# Patient Record
Sex: Female | Born: 1975 | Race: Asian | Hispanic: No | Marital: Married | Smoking: Never smoker
Health system: Southern US, Community
[De-identification: ages and names within clinical notes are randomized; demographics above are authoritative.]

## PROBLEM LIST (undated history)

## (undated) DIAGNOSIS — J45909 Unspecified asthma, uncomplicated: Secondary | ICD-10-CM

## (undated) DIAGNOSIS — A159 Respiratory tuberculosis unspecified: Secondary | ICD-10-CM

## (undated) DIAGNOSIS — R12 Heartburn: Secondary | ICD-10-CM

## (undated) HISTORY — PX: NO PAST SURGERIES: SHX2092

## (undated) HISTORY — DX: Heartburn: R12

## (undated) HISTORY — DX: Respiratory tuberculosis unspecified: A15.9

## (undated) HISTORY — DX: Unspecified asthma, uncomplicated: J45.909

## (undated) HISTORY — PX: OTHER SURGICAL HISTORY: SHX169

---

## 2022-01-20 ENCOUNTER — Encounter (INDEPENDENT_AMBULATORY_CARE_PROVIDER_SITE_OTHER): Payer: Self-pay

## 2022-01-20 ENCOUNTER — Encounter: Payer: Self-pay | Admitting: Nurse Practitioner

## 2022-01-20 ENCOUNTER — Other Ambulatory Visit: Payer: Self-pay

## 2022-01-20 ENCOUNTER — Ambulatory Visit (INDEPENDENT_AMBULATORY_CARE_PROVIDER_SITE_OTHER): Payer: Medicaid Other | Admitting: Nurse Practitioner

## 2022-01-20 VITALS — BP 111/77 | HR 74 | Temp 98.9°F | Ht 65.0 in | Wt 148.6 lb

## 2022-01-20 DIAGNOSIS — M545 Low back pain, unspecified: Secondary | ICD-10-CM

## 2022-01-20 DIAGNOSIS — R319 Hematuria, unspecified: Secondary | ICD-10-CM | POA: Diagnosis not present

## 2022-01-20 DIAGNOSIS — R112 Nausea with vomiting, unspecified: Secondary | ICD-10-CM

## 2022-01-20 DIAGNOSIS — R519 Headache, unspecified: Secondary | ICD-10-CM | POA: Diagnosis not present

## 2022-01-20 DIAGNOSIS — K219 Gastro-esophageal reflux disease without esophagitis: Secondary | ICD-10-CM

## 2022-01-20 DIAGNOSIS — G8929 Other chronic pain: Secondary | ICD-10-CM

## 2022-01-20 LAB — POCT URINALYSIS DIP (CLINITEK)
Glucose, UA: NEGATIVE mg/dL
Leukocytes, UA: NEGATIVE
Nitrite, UA: NEGATIVE
POC PROTEIN,UA: 30 — AB
Spec Grav, UA: >=1.03 — AB (ref 1.010–1.025)
Urobilinogen, UA: 0.2 E.U./dL
pH, UA: 5 (ref 5.0–8.0)

## 2022-01-20 MED ORDER — CYCLOBENZAPRINE HCL 5 MG PO TABS: 5.0000 mg | ORAL_TABLET | Freq: Three times a day (TID) | ORAL | 1 refills | Status: DC | PRN

## 2022-01-20 MED ORDER — ONDANSETRON 4 MG PO TBDP: 4.0000 mg | ORAL_TABLET | Freq: Three times a day (TID) | ORAL | 0 refills | Status: DC | PRN

## 2022-01-20 MED ORDER — SM MIGRAINE RELIEF 250-250-65 MG PO TABS
2.0000 | ORAL_TABLET | Freq: Three times a day (TID) | ORAL | 0 refills | Status: DC | PRN
  Filled 2022-02-03: qty 50, 9d supply, fill #0

## 2022-01-20 NOTE — Progress Notes (Signed)
@Patient  ID: Marcia Miller, female    DOB: 02/05/1976, 46 y.o.   MRN: [id number] ? ?Chief Complaint  ?Patient presents with  ? Establish Care  ?  Patient is here to establish care today like to discuss her asthma and stomach issues. ?Patient states that she has been having a dry cough x 5 to 6 days. ?Patient states that she has been having stomach issues x 1 week with symptoms of nausea, vomiting, constipation, no appetite.  ? ? ?Referring provider: ?No ref. provider found ? ? ?HPI ? ?Patient presents today with Arabic interpreter.  Patient is a refugee who just came to the [place] this weekend.  She is from [place].  She did have good health care-[place] does bring her medications with her today so that we can add them to the medication list for refills.  She states that she has been having some dry cough since returning [place].  She has been having stomach issues with vomiting constipation and loss of appetite.  Patient also complains of acid reflux and chronic headaches.  We discussed that we will check some labs. Denies f/c/s, n/v/d, hemoptysis, PND, chest pain or edema. ? ? ? ? ?No Known Allergies ? ? ?There is no immunization history on file for this patient. ? ?Past Medical History:  ?Diagnosis Date  ? Asthma   ? ? ?Tobacco History: ?Social History  ? ?Tobacco Use  ?Smoking Status Unknown  ?Smokeless Tobacco Not on file  ? ?Counseling given: Not Answered ? ? ?Outpatient Encounter Medications as of 01/20/2022  ?Medication Sig  ? aspirin-acetaminophen-caffeine (EXCEDRIN MIGRAINE) 250-250-65 MG tablet Take 2 tablets by mouth every 8 (eight) hours as needed for headache.  ? budesonide-formoterol (SYMBICORT) 160-4.5 MCG/ACT inhaler Inhale 2 puffs into the lungs 2 (two) times daily.  ? cyclobenzaprine (FLEXERIL) 5 MG tablet Take 1 tablet (5 mg total) by mouth 3 (three) times daily as needed for muscle spasms.  ? lansoprazole (PREVACID) 30 MG capsule Take 30 mg by mouth daily at 12 noon.  ?  loratadine (CLARITIN) 5 MG chewable tablet Chew 5 mg by mouth daily.  ? omeprazole (PRILOSEC) 20 MG capsule Take 20 mg by mouth daily.  ? ondansetron (ZOFRAN-ODT) 4 MG disintegrating tablet Take 1 tablet (4 mg total) by mouth every 8 (eight) hours as needed for nausea or vomiting.  ? Phenir-PE-Sod Sal-Caff Cit (COUGH/COLD MEDICINE PO) Take by mouth.  ? ?No facility-administered encounter medications on file as of 01/20/2022.  ? ? ? ?Review of Systems ? ?Review of Systems  ?Constitutional: Negative.   ?HENT: Negative.    ?Respiratory:  Positive for cough.   ?Cardiovascular: Negative.   ?Gastrointestinal:  Positive for constipation and vomiting.  ?Musculoskeletal:  Positive for back pain.  ?Allergic/Immunologic: Negative.   ?Neurological: Negative.   ?Psychiatric/Behavioral: Negative.     ? ? ? ?Physical Exam ? ?BP 111/77   Pulse 74   Temp 98.9 ?F (37.2 ?C)   Ht 5\' 5"  (1.651 m)   Wt 148 lb 9.6 oz (67.4 kg)   SpO2 97%   BMI 24.73 kg/m?  ? ?Wt Readings from Last 5 Encounters:  ?01/20/22 148 lb 9.6 oz (67.4 kg)  ? ? ? ?Physical Exam ?Vitals and nursing note reviewed.  ?Constitutional:   ?   General: She is not in acute distress. ?   Appearance: She is well-developed.  ?Cardiovascular:  ?   Rate and Rhythm: Normal rate and regular rhythm.  ?Pulmonary:  ?   Effort: Pulmonary effort  is normal.  ?   Breath sounds: Normal breath sounds.  ?Neurological:  ?   Mental Status: She is alert and oriented to person, place, and time.  ? ? ? ? ?Assessment & Plan:  ? ?Acute bilateral low back pain without sciatica ?- POCT UA - Microscopic Only ?- cyclobenzaprine (FLEXERIL) 5 MG tablet; Take 1 tablet (5 mg total) by mouth 3 (three) times daily as needed for muscle spasms.  Dispense: 30 tablet; Refill: 1 ? ?2. Nausea and vomiting, unspecified vomiting type ? ?- ondansetron (ZOFRAN-ODT) 4 MG disintegrating tablet; Take 1 tablet (4 mg total) by mouth every 8 (eight) hours as needed for nausea or vomiting.  Dispense: 20 tablet; Refill:  0 ? ?3. Gastroesophageal reflux disease without esophagitis ? ?Continue current reflux medications ? ?4. Chronic nonintractable headache, unspecified headache type ? ?- cyclobenzaprine (FLEXERIL) 5 MG tablet; Take 1 tablet (5 mg total) by mouth 3 (three) times daily as needed for muscle spasms.  Dispense: 30 tablet; Refill: 1 ?- aspirin-acetaminophen-caffeine (EXCEDRIN MIGRAINE) 250-250-65 MG tablet; Take 2 tablets by mouth every 8 (eight) hours as needed for headache.  Dispense: 30 tablet; Refill: 0 ? ?Follow up: ? ?Follow up in 4-6 weeks for physical with amy or Dr. Redmond Pulling ? ? ? ? ?Fenton Foy, NP ?01/25/2022 ? ?

## 2022-01-20 NOTE — Patient Instructions (Signed)
1. Acute bilateral low back pain without sciatica ? ?- POCT UA - Microscopic Only ?- cyclobenzaprine (FLEXERIL) 5 MG tablet; Take 1 tablet (5 mg total) by mouth 3 (three) times daily as needed for muscle spasms.  Dispense: 30 tablet; Refill: 1 ? ?2. Nausea and vomiting, unspecified vomiting type ? ?- ondansetron (ZOFRAN-ODT) 4 MG disintegrating tablet; Take 1 tablet (4 mg total) by mouth every 8 (eight) hours as needed for nausea or vomiting.  Dispense: 20 tablet; Refill: 0 ? ?3. Gastroesophageal reflux disease without esophagitis ? ?Continue current reflux medications ? ?4. Chronic nonintractable headache, unspecified headache type ? ?- cyclobenzaprine (FLEXERIL) 5 MG tablet; Take 1 tablet (5 mg total) by mouth 3 (three) times daily as needed for muscle spasms.  Dispense: 30 tablet; Refill: 1 ?- aspirin-acetaminophen-caffeine (EXCEDRIN MIGRAINE) 250-250-65 MG tablet; Take 2 tablets by mouth every 8 (eight) hours as needed for headache.  Dispense: 30 tablet; Refill: 0 ? ?Follow up: ? ?Follow up in 4-6 weeks for physical with amy or Dr. Redmond Pulling ? ? ? ?

## 2022-01-21 ENCOUNTER — Telehealth: Payer: Self-pay | Admitting: *Deleted

## 2022-01-21 LAB — COVID-19, FLU A+B AND RSV
Influenza A, NAA: NOT DETECTED
Influenza B, NAA: NOT DETECTED
RSV, NAA: NOT DETECTED
SARS-CoV-2, NAA: NOT DETECTED

## 2022-01-21 NOTE — Telephone Encounter (Signed)
-----   Message from Ivonne Andrew, NP sent at 01/21/2022  1:14 PM EDT ----- ?Please call to let patient know that with the symptoms she is having and the large amount of ketones in her urine I think she needs to be evaluated in the ED. Fasting for Ramadan could cause this and make this condition worse. Please go to the ED for further evaluation.  ?

## 2022-01-21 NOTE — Telephone Encounter (Signed)
Patient verified DOB ?Patient and husband are aware of patient needing to report to the ED to address ketones in her urine. ?

## 2022-01-22 ENCOUNTER — Emergency Department (HOSPITAL_COMMUNITY)
Admission: EM | Admit: 2022-01-22 | Discharge: 2022-01-22 | Disposition: A | Discharge status: Home / Self Care | Payer: Medicaid Other | Attending: Emergency Medicine | Admitting: Emergency Medicine

## 2022-01-22 ENCOUNTER — Emergency Department (HOSPITAL_COMMUNITY): Payer: Medicaid Other

## 2022-01-22 ENCOUNTER — Other Ambulatory Visit: Payer: Self-pay

## 2022-01-22 DIAGNOSIS — G8929 Other chronic pain: Secondary | ICD-10-CM | POA: Insufficient documentation

## 2022-01-22 DIAGNOSIS — M5136 Other intervertebral disc degeneration, lumbar region: Secondary | ICD-10-CM | POA: Insufficient documentation

## 2022-01-22 DIAGNOSIS — Z7951 Long term (current) use of inhaled steroids: Secondary | ICD-10-CM | POA: Insufficient documentation

## 2022-01-22 DIAGNOSIS — M545 Low back pain, unspecified: Secondary | ICD-10-CM

## 2022-01-22 DIAGNOSIS — N3000 Acute cystitis without hematuria: Secondary | ICD-10-CM | POA: Insufficient documentation

## 2022-01-22 DIAGNOSIS — Z7982 Long term (current) use of aspirin: Secondary | ICD-10-CM | POA: Diagnosis not present

## 2022-01-22 DIAGNOSIS — R110 Nausea: Secondary | ICD-10-CM | POA: Diagnosis not present

## 2022-01-22 LAB — COMPREHENSIVE METABOLIC PANEL
ALT: 13 U/L (ref 0–44)
AST: 17 U/L (ref 15–41)
Albumin: 4.1 g/dL (ref 3.5–5.0)
Alkaline Phosphatase: 56 U/L (ref 38–126)
Anion gap: 8 (ref 5–15)
BUN: <5 mg/dL — ABNORMAL LOW (ref 6–20)
CO2: 23 mmol/L (ref 22–32)
Calcium: 10.2 mg/dL (ref 8.9–10.3)
Chloride: 107 mmol/L (ref 98–111)
Creatinine, Ser: 0.63 mg/dL (ref 0.44–1.00)
GFR, Estimated: >60 mL/min (ref >60)
Glucose, Bld: 101 mg/dL — ABNORMAL HIGH (ref 70–99)
Potassium: 3.8 mmol/L (ref 3.5–5.1)
Sodium: 138 mmol/L (ref 135–145)
Total Bilirubin: 1.7 mg/dL — ABNORMAL HIGH (ref 0.3–1.2)
Total Protein: 7.1 g/dL (ref 6.5–8.1)

## 2022-01-22 LAB — URINALYSIS, ROUTINE W REFLEX MICROSCOPIC
Bilirubin Urine: NEGATIVE
Glucose, UA: NEGATIVE mg/dL
Ketones, ur: 80 mg/dL — AB
Nitrite: NEGATIVE
Protein, ur: 30 mg/dL — AB
Specific Gravity, Urine: 1.019 (ref 1.005–1.030)
pH: 5 (ref 5.0–8.0)

## 2022-01-22 LAB — CBC
HCT: 42.2 % (ref 36.0–46.0)
Hemoglobin: 13.8 g/dL (ref 12.0–15.0)
MCH: 29.9 pg (ref 26.0–34.0)
MCHC: 32.7 g/dL (ref 30.0–36.0)
MCV: 91.3 fL (ref 80.0–100.0)
Platelets: 220 10*3/uL (ref 150–400)
RBC: 4.62 MIL/uL (ref 3.87–5.11)
RDW: 12.8 % (ref 11.5–15.5)
WBC: 6.7 10*3/uL (ref 4.0–10.5)
nRBC: 0 % (ref 0.0–0.2)

## 2022-01-22 LAB — I-STAT BETA HCG BLOOD, ED (MC, WL, AP ONLY): I-stat hCG, quantitative: <5 m[IU]/mL (ref <5)

## 2022-01-22 LAB — LIPASE, BLOOD: Lipase: 38 U/L (ref 11–51)

## 2022-01-22 MED ORDER — ONDANSETRON HCL 4 MG/2ML IJ SOLN
4.0000 mg | Freq: Once | INTRAMUSCULAR | Status: AC
  Administered 2022-01-22: 4 mg via INTRAVENOUS
  Filled 2022-01-22: qty 2

## 2022-01-22 MED ORDER — SODIUM CHLORIDE 0.9 % IV BOLUS
1000.0000 mL | Freq: Once | INTRAVENOUS | Status: AC
Start: 2022-01-22 — End: 2022-01-22
  Administered 2022-01-22: 1000 mL via INTRAVENOUS

## 2022-01-22 MED ORDER — CEPHALEXIN 500 MG PO CAPS: 500.0000 mg | ORAL_CAPSULE | Freq: Two times a day (BID) | ORAL | 0 refills | Status: AC

## 2022-01-22 MED ORDER — LIDOCAINE 5 % EX PTCH
1.0000 | MEDICATED_PATCH | CUTANEOUS | 0 refills | Status: DC
Start: 2022-01-22 — End: 2022-12-27
  Filled 2022-02-03: qty 15, 15d supply, fill #0

## 2022-01-22 NOTE — Telephone Encounter (Signed)
Charlcie Cradle, Case Manager with [employer] called to interpret for the patient, received approval from the patient to speak to [place] "Yes". He says that she did not understand the results given on yesterday. I advised of the results. Patient had additional questions. I advised I will call the [employer] to obtain an Arabic interpreter, he agreed. Using [employer] [phone]. Patient given results as noted by Angus Seller, NP on 01/21/22. She asked about the ketones, explained that additional testing will be needed in the ED to figure out what is going with the extra Ketones in the urine and maybe blood work. She verbalized understanding and says she will go today to the ED.  ? ?

## 2022-01-22 NOTE — ED Triage Notes (Addendum)
Pt here POV from home d/t back pain, nausea., Unable to eat X8 days. Pt states she just came to this country from [place]. Denies fever. 8/10 pain.  Pt ambulatory to triage. Has not taken any OTC medications. Pt seen by family MD. Prescribed zofran and muscle relaxer without relief.  ?

## 2022-01-22 NOTE — ED Notes (Signed)
Pt states she forgot to tell someone that they also sent her here because of her liver enzymes and told her they could check everything out here.  ?

## 2022-01-22 NOTE — ED Provider Notes (Signed)
?[facility] EMERGENCY DEPARTMENT ?Provider Note ? ? ?CSN: [id number] ?Arrival date & time: 01/22/22  1138 ? ?  ? ?History ? ?Chief Complaint  ?Patient presents with  ? Back Pain  ? ? ?Lindsea Mohamad Lecomte is a 46 y.o. female. ? ?The history is provided by the patient, the spouse and medical records. The history is limited by a language barrier. A language interpreter was used (arabic).  ?Back Pain ?Location:  Lumbar spine ?Quality:  Aching ?Radiates to:  Does not radiate ?Pain severity:  Severe ?Onset quality:  Gradual ?Duration:  12 months ?Timing:  Constant ?Progression:  Worsening ?Chronicity:  Chronic ?Relieved by:  Nothing ?Worsened by:  Nothing ?Ineffective treatments:  None tried ?Associated symptoms: no abdominal pain, no chest pain, no dysuria, no fever and no numbness   ? ?  ? ?Home Medications ?Prior to Admission medications   ?Medication Sig Start Date End Date Taking? Authorizing Provider  ?aspirin-acetaminophen-caffeine (EXCEDRIN MIGRAINE) 250-250-65 MG tablet Take 2 tablets by mouth every 8 (eight) hours as needed for headache. 01/20/22   Ivonne Andrew, NP  ?budesonide-formoterol (SYMBICORT) 160-4.5 MCG/ACT inhaler Inhale 2 puffs into the lungs 2 (two) times daily.    [provider]  ?cyclobenzaprine (FLEXERIL) 5 MG tablet Take 1 tablet (5 mg total) by mouth 3 (three) times daily as needed for muscle spasms. 01/20/22   Ivonne Andrew, NP  ?lansoprazole (PREVACID) 30 MG capsule Take 30 mg by mouth daily at 12 noon.    [provider]  ?loratadine (CLARITIN) 5 MG chewable tablet Chew 5 mg by mouth daily.    [provider]  ?omeprazole (PRILOSEC) 20 MG capsule Take 20 mg by mouth daily.    [provider]  ?ondansetron (ZOFRAN-ODT) 4 MG disintegrating tablet Take 1 tablet (4 mg total) by mouth every 8 (eight) hours as needed for nausea or vomiting. 01/20/22   Ivonne Andrew, NP  ?Phenir-PE-Sod Sal-Caff Cit (COUGH/COLD MEDICINE PO) Take by  mouth.    [provider]  ?   ? ?Allergies    ?Patient has no known allergies.   ? ?Review of Systems   ?Review of Systems  ?Constitutional:  Negative for chills, fatigue and fever.  ?HENT:  Negative for congestion.   ?Respiratory:  Negative for cough, chest tightness, shortness of breath and wheezing.   ?Cardiovascular:  Negative for chest pain and palpitations.  ?Gastrointestinal:  Positive for nausea. Negative for abdominal pain, constipation, diarrhea and vomiting.  ?Genitourinary:  Positive for flank pain. Negative for dysuria and frequency.  ?Musculoskeletal:  Positive for back pain. Negative for neck pain and neck stiffness.  ?Skin:  Negative for rash and wound.  ?Neurological:  Negative for light-headedness and numbness.  ?Psychiatric/Behavioral:  Negative for agitation and confusion.   ?All other systems reviewed and are negative. ? ?Physical Exam ?Updated Vital Signs ?BP 109/82 (BP Location: Right Arm)   Pulse 79   Temp 98.9 ?F (37.2 ?C) (Oral)   Resp 16   SpO2 97%  ?Physical Exam ?Vitals and nursing note reviewed.  ?Constitutional:   ?   General: She is not in acute distress. ?   Appearance: She is well-developed. She is not ill-appearing, toxic-appearing or diaphoretic.  ?HENT:  ?   Head: Normocephalic and atraumatic.  ?   Nose: No congestion or rhinorrhea.  ?   Mouth/Throat:  ?   Mouth: Mucous membranes are moist.  ?   Pharynx: No oropharyngeal exudate or posterior oropharyngeal erythema.  ?Eyes:  ?  Extraocular Movements: Extraocular movements intact.  ?   Conjunctiva/sclera: Conjunctivae normal.  ?   Pupils: Pupils are equal, round, and reactive to light.  ?Cardiovascular:  ?   Rate and Rhythm: Normal rate and regular rhythm.  ?   Heart sounds: No murmur heard. ?Pulmonary:  ?   Effort: Pulmonary effort is normal. No respiratory distress.  ?   Breath sounds: Normal breath sounds. No wheezing, rhonchi or rales.  ?Chest:  ?   Chest wall: No tenderness.  ?Abdominal:  ?   General: Abdomen is  flat.  ?   Palpations: Abdomen is soft.  ?   Tenderness: There is no abdominal tenderness. There is no right CVA tenderness, left CVA tenderness, guarding or rebound.  ?Musculoskeletal:     ?   General: Tenderness present. No swelling.  ?   Cervical back: Neck supple.  ?   Right lower leg: No edema.  ?   Left lower leg: No edema.  ?Skin: ?   General: Skin is warm and dry.  ?   Capillary Refill: Capillary refill takes less than 2 seconds.  ?   Findings: No erythema.  ?Neurological:  ?   General: No focal deficit present.  ?   Mental Status: She is alert.  ?   Sensory: No sensory deficit.  ?   Motor: No weakness.  ?Psychiatric:     ?   Mood and Affect: Mood normal.  ? ? ?ED Results / Procedures / Treatments   ?Labs ?(all labs ordered are listed, but only abnormal results are displayed) ?Labs Reviewed  ?COMPREHENSIVE METABOLIC PANEL - Abnormal; Notable for the following components:  ?    Result Value  ? Glucose, Bld 101 (*)   ? BUN <5 (*)   ? Total Bilirubin 1.7 (*)   ? All other components within normal limits  ?URINALYSIS, ROUTINE W REFLEX MICROSCOPIC - Abnormal; Notable for the following components:  ? APPearance HAZY (*)   ? Hgb urine dipstick SMALL (*)   ? Ketones, ur 80 (*)   ? Protein, ur 30 (*)   ? Leukocytes,Ua LARGE (*)   ? Bacteria, UA MANY (*)   ? All other components within normal limits  ?URINE CULTURE  ?LIPASE, BLOOD  ?CBC  ?I-STAT BETA HCG BLOOD, ED ([facility], [facility], [facility] ONLY)  ? ? ?EKG ?None ? ?Radiology ?CT Renal Stone Study ? ?Result Date: 01/22/2022 ?CLINICAL DATA:  Flank pain, nausea EXAM: CT ABDOMEN AND PELVIS WITHOUT CONTRAST TECHNIQUE: Multidetector CT imaging of the abdomen and pelvis was performed following the standard protocol without IV contrast. RADIATION DOSE REDUCTION: This exam was performed according to the departmental dose-optimization program which includes automated exposure control, adjustment of the mA and/or kV according to patient size and/or use of iterative reconstruction technique.  COMPARISON:  None. FINDINGS: There is significant motion artifacts in the upper abdomen limiting the study. Lower chest: Unremarkable. Hepatobiliary: No focal abnormality is seen. Evaluation of liver and gallbladder is somewhat limited by motion artifacts. There is no dilation of bile ducts. Pancreas: No focal abnormality is seen. Spleen: Unremarkable. Adrenals/Urinary Tract: Adrenals are not enlarged. There is no hydronephrosis. Evaluation of renal cortex is limited by motion artifacts. No definite renal or ureteral stones are seen. Urinary bladder is not distended. Stomach/Bowel: Stomach is not distended. Small bowel loops are not dilated. Appendix is not dilated. There is no significant wall thickening in colon. There is no pericolic stranding. Vascular/Lymphatic: Unremarkable. Reproductive: IUD is seen in the uterus. There is  trace amount of free fluid in cul-de-sac. Other: There is no ascites or pneumoperitoneum. Small umbilical hernia containing fat is seen. Musculoskeletal: Degenerative changes are noted with posterior bony spurs at L4-L5 level. IMPRESSION: Motion limited study. There is no evidence of intestinal obstruction or pneumoperitoneum. There is no hydronephrosis. Appendix is not dilated. Trace amount of free fluid in the pelvis may suggest recent rupture of ovarian cyst or follicle. IUD is seen in the uterus. Other findings as described in the body of the report. Electronically Signed   By: Ernie AvenaPalani  Rathinasamy M.D.   On: 01/22/2022 15:15   ? ?Procedures ?Procedures  ? ? ?Medications Ordered in ED ?Medications  ?sodium chloride 0.9 % bolus 1,000 mL (1,000 mLs Intravenous New Bag/Given 01/22/22 1510)  ?ondansetron (ZOFRAN) injection 4 mg (4 mg Intravenous Given 01/22/22 1510)  ? ? ?ED Course/ Medical Decision Making/ A&P ?  ?                        ?Medical Decision Making ?Amount and/or Complexity of Data Reviewed ?Labs: ordered. ?Radiology: ordered. ? ?Risk ?Prescription drug management. ? ? ? ?Chana  Mohamad Nuno is a 46 y.o. female with a past medical history significant for GERD and asthma who recently came here from [place] and speaks Arabic presents with her husband for for evaluation of 1 week of

## 2022-01-22 NOTE — Discharge Instructions (Addendum)
Your work-up today is suggestive of urinary tract infection that may have led to your nausea, vomiting, flank/back pain, and decreased appetite.  Please take the antibiotics for the next week to treat.  Please consider following up with a spine doctor given the degenerative arthritis we see on the imaging.  Please use the muscle relaxant at home and consider using the Lidoderm patches to help with the back pain as well.  Please rest and stay hydrated and use your nausea medicine at home.  If any symptoms change or worsen acutely, please return to the nearest emergency department. ?

## 2022-01-22 NOTE — ED Notes (Signed)
Patient transported to CT 

## 2022-01-22 NOTE — ED Notes (Signed)
ED Provider at bedside using interpretor  ?

## 2022-01-23 LAB — URINE CULTURE

## 2022-01-24 LAB — URINE CULTURE: Culture: 60000 — AB

## 2022-01-25 ENCOUNTER — Telehealth: Payer: Self-pay | Admitting: *Deleted

## 2022-01-25 DIAGNOSIS — M545 Low back pain, unspecified: Secondary | ICD-10-CM | POA: Insufficient documentation

## 2022-01-25 NOTE — Telephone Encounter (Signed)
Post ED Visit - Positive Culture Follow-up ? ?Culture report reviewed by antimicrobial stewardship pharmacist: ?[facility] ?[]  , Enzo Bi.D. ?[]  [address], .D., BCPS AQ-ID ?[]  Celedonio Miyamoto, Pharm.D., BCPS ?[]  [address], .D., BCPS ?[]  [place], .D., BCPS, AAHIVP ?[]  Georgina Pillion, Pharm.D., BCPS, AAHIVP ?[]  [address], PharmD, BCPS ?[]  , PharmD, BCPS ?[]  [place], PharmD, BCPS ?[]  [address], PharmD ?[]  , PharmD, BCPS ?[]  Estella Husk, PharmD ? ? [facility] ?[]  Lysle Pearl, PharmD ?[]  , PharmD ?[]  Phillips Climes, PharmD ?[]  , Rph ?[]  Agapito Games) , PharmD ?[]  Verlan Friends, PharmD ?[]  , PharmD ?[]  Mervyn Gay, PharmD ?[]  , PharmD ?[]  Vinnie Level, PharmD ?[]  Gerri Spore, PharmD ?[]  , PharmD ?[]  Len Childs, PharmD ? ? ?Positive urine culture ?Treated with Cephalexin, organism sensitive to the same and no further patient follow-up is required at this time.  , PharmD ? ?Greer Pickerel ?01/25/2022, 8:08 AM ?  ?

## 2022-01-25 NOTE — Assessment & Plan Note (Signed)
-   POCT UA - Microscopic Only ?- cyclobenzaprine (FLEXERIL) 5 MG tablet; Take 1 tablet (5 mg total) by mouth 3 (three) times daily as needed for muscle spasms.  Dispense: 30 tablet; Refill: 1 ? ?2. Nausea and vomiting, unspecified vomiting type ? ?- ondansetron (ZOFRAN-ODT) 4 MG disintegrating tablet; Take 1 tablet (4 mg total) by mouth every 8 (eight) hours as needed for nausea or vomiting.  Dispense: 20 tablet; Refill: 0 ? ?3. Gastroesophageal reflux disease without esophagitis ? ?Continue current reflux medications ? ?4. Chronic nonintractable headache, unspecified headache type ? ?- cyclobenzaprine (FLEXERIL) 5 MG tablet; Take 1 tablet (5 mg total) by mouth 3 (three) times daily as needed for muscle spasms.  Dispense: 30 tablet; Refill: 1 ?- aspirin-acetaminophen-caffeine (EXCEDRIN MIGRAINE) 250-250-65 MG tablet; Take 2 tablets by mouth every 8 (eight) hours as needed for headache.  Dispense: 30 tablet; Refill: 0 ? ?Follow up: ? ?Follow up in 4-6 weeks for physical with amy or Dr. Andrey Campanile ?

## 2022-02-03 ENCOUNTER — Other Ambulatory Visit (HOSPITAL_COMMUNITY): Payer: Self-pay

## 2022-02-03 MED ORDER — CEPHALEXIN 500 MG PO CAPS
500.0000 mg | ORAL_CAPSULE | Freq: Two times a day (BID) | ORAL | 0 refills | Status: DC
  Filled 2022-02-03: qty 14, 7d supply, fill #0

## 2022-02-03 NOTE — Congregational Nurse Program (Signed)
?  Dept: [phone] ? ? ?Congregational Nurse Program Note ? ?Date of Encounter: 02/03/2022 ? ?Past Medical History: ?Past Medical History:  ?Diagnosis Date  ? Asthma   ? ? ?Encounter Details: ? CNP Questionnaire - 02/03/22 1254   ? ?  ? Questionnaire  ? Do you give verbal consent to treat you today? Yes   ? Location Patient Served  [facility]   ? Visit Setting Church or Organization   ? Patient Status Refugee   ? Insurance Uninsured ([place] Connects/Self-Pay)   ? Insurance Referral Medicaid   ? Medication Have Medication Insecurities;Provided Medication Assistance;Referred to Medication Assistance   ? Screening Referrals N/A   ? Medical Referral [facility]   ? Medical Appointment Made N/A   ? Food N/A   ? Transportation Need transportation assistance;Provided transportation assistance   ? Housing/Utilities N/A   ? Interpersonal Safety N/A   ? Intervention Advocate;Navigate Healthcare System;Case Management;Counsel;Educate;Support   ? ED Visit Averted Yes   ? Life-Saving Intervention Made N/A   ? ?  ?  ? ?  ? Patient is requesting medication assistance. Medication  assistance provided by [facility].  ? ?Arman Bogus RN BSn PCCN  ?[facility] ?[phone]-cell ?[phone]-office ? ? ? ? ?

## 2022-02-27 NOTE — Progress Notes (Signed)
?Subjective:  ? ? Marcia Miller - 46 y.o. female MRN [id number]  Date of birth: 03-Sep-1976 ? ?HPI ? ?[name] is to establish care and annual physical exam. She is accompanied by her husband.  ? ?Current issues and/or concerns: ?Patient last seen at [facility] Emergency Department 01/22/2022 for chronic low back pain, acute cystitis without hematuria, and nausea. Reports prescribed medications did not help with back pain. Reports has not established appointment with Neurosurgery as recommended at hospital discharge stating she does not have their contact information. Requesting medication for intermittent headaches. Denies worst headache of life, chest pain, shortness of breath and additional red flag symptoms. Reports thinks she has a UTI.  ? ? ?ROS per HPI  ? ? ?Health Maintenance:  ?Health Maintenance Due  ?Topic Date Due  ? Hepatitis C Screening  Never done  ? TETANUS/TDAP  Never done  ? PAP SMEAR-Modifier  Never done  ? ? ? ?Past Medical History: ?Patient Active Problem List  ? Diagnosis Date Noted  ? Acute bilateral low back pain without sciatica 01/25/2022  ? GERD (gastroesophageal reflux disease) 01/20/2022  ? ? ?Social History  ? reports that she has never smoked. She has never been exposed to tobacco smoke. She has never used smokeless tobacco. She reports that she does not drink alcohol and does not use drugs.  ? ?Family History  ?Family history is unknown by patient.  ? ?Medications: reviewed and updated ?  ?Objective:  ? Physical Exam ?BP 106/73 (BP Location: Left Arm, Patient Position: Sitting, Cuff Size: Normal)   Pulse 73   Temp 98.3 ?F (36.8 ?C)   Resp 18   Ht 5' 4.96" (1.65 m)   Wt 137 lb (62.1 kg)   SpO2 97%   BMI 22.83 kg/m?  ? ?Physical Exam ?HENT:  ?   Head: Normocephalic and atraumatic.  ?   Right Ear: Tympanic membrane, ear canal and external ear normal.  ?   Left Ear: Tympanic membrane, ear canal and external ear normal.  ?   Nose: Nose normal.  ?    Mouth/Throat:  ?   Mouth: Mucous membranes are moist.  ?   Pharynx: Oropharynx is clear.  ?Eyes:  ?   Extraocular Movements: Extraocular movements intact.  ?   Conjunctiva/sclera: Conjunctivae normal.  ?   Pupils: Pupils are equal, round, and reactive to light.  ?Cardiovascular:  ?   Rate and Rhythm: Normal rate and regular rhythm.  ?   Pulses: Normal pulses.  ?   Heart sounds: Normal heart sounds.  ?Pulmonary:  ?   Effort: Pulmonary effort is normal.  ?   Breath sounds: Normal breath sounds.  ?Chest:  ?   Comments: Patient declined exam. ?Abdominal:  ?   General: Bowel sounds are normal.  ?   Palpations: Abdomen is soft.  ?   Tenderness: There is abdominal tenderness.  ?Genitourinary: ?   Comments: Patient declined exam. ?Musculoskeletal:     ?   General: Normal range of motion.  ?   Cervical back: Normal range of motion and neck supple.  ?Skin: ?   General: Skin is warm and dry.  ?   Capillary Refill: Capillary refill takes less than 2 seconds.  ?Neurological:  ?   General: No focal deficit present.  ?   Mental Status: She is alert and oriented to person, place, and time.  ?Psychiatric:     ?   Mood and Affect: Mood normal.     ?  Behavior: Behavior normal.  ? ?   ?Results for orders placed or performed in visit on 03/04/22  ?POCT URINALYSIS DIP (CLINITEK)  ?Result Value Ref Range  ? Color, UA brown (A) yellow  ? Clarity, UA turbid (A) clear  ? Glucose, UA negative negative mg/dL  ? Bilirubin, UA small (A) negative  ? Ketones, POC UA negative negative mg/dL  ? Spec Grav, UA >=1.030 (A) 1.010 - 1.025  ? Blood, UA large (A) negative  ? pH, UA 5.5 5.0 - 8.0  ? POC PROTEIN,UA =100 (A) negative, trace  ? Urobilinogen, UA 0.2 0.2 or 1.0 E.U./dL  ? Nitrite, UA Negative Negative  ? Leukocytes, UA Negative Negative  ? ? ?Assessment & Plan:  ?1. Annual physical exam: ?- Counseled on 150 minutes of exercise per week as tolerated, healthy eating (including decreased daily intake of saturated fats, cholesterol, added sugars,  sodium), STI prevention, and routine healthcare maintenance. ? ?2. Diabetes mellitus screening: ?- Hemoglobin A1c to screen for pre-diabetes/diabetes. ?- Hemoglobin A1c ? ?3. Screening cholesterol level: ?- Lipid panel to screen for high cholesterol.  ?- Lipid Panel ? ?4. Thyroid disorder screen: ?- TSH to check thyroid function.  ?- TSH ? ?5. Need for hepatitis C screening test: ?- Hepatitis C antibody to screen for hepatitis C.  ?- Hepatitis C Antibody ? ?6. Pap smear for cervical cancer screening: ?7. Routine screening for STI (sexually transmitted infection): ?- Referral to Gynecology for further evaluation and management.  ?- Ambulatory referral to Gynecology ? ?8. Encounter for screening for HIV: ?- HIV antibody to screen for human immunodeficiency virus.  ?- HIV antibody (with reflex) ? ?9. Chronic low back pain, unspecified back pain laterality, unspecified whether sciatica present: ?- Referral to Neurosurgery for further evaluation and management.  ?- Ambulatory referral to Neurosurgery ? ?10. Nonintractable headache, unspecified chronicity pattern, unspecified headache type: ?- Ibuprofen as prescribed. Counseled on medication adherence and adverse effects.  ?- Follow-up with primary provider as scheduled.  ?- ibuprofen (ADVIL) 600 MG tablet; Take 1 tablet (600 mg total) by mouth every 8 (eight) hours as needed.  Dispense: 30 tablet; Refill: 2 ? ?11. Lower urinary tract symptoms: ?- No urinary tract infection. Patient reported that she is on menses today.  ?- POCT URINALYSIS DIP (CLINITEK) ? ?12. Language barrier: ?- [employer] participated during today's appointment. Interpreter Name: Burna Sis, ID#: [id number]. ? ? ? ?Patient was given clear instructions to go to Emergency Department or return to medical center if symptoms don't improve, worsen, or new problems develop.The patient verbalized understanding. ? ?I discussed the assessment and treatment plan with the patient. The patient was provided an  opportunity to ask questions and all were answered. The patient agreed with the plan and demonstrated an understanding of the instructions. ?  ?The patient was advised to call back or seek an in-person evaluation if the symptoms worsen or if the condition fails to improve as anticipated. ? ? ? ?Ricky Stabs, NP ?03/04/2022, 10:10 PM ?Primary Care at [facility]  ? ?

## 2022-03-04 ENCOUNTER — Encounter: Payer: Self-pay | Admitting: Family

## 2022-03-04 ENCOUNTER — Other Ambulatory Visit: Payer: Self-pay | Admitting: Family

## 2022-03-04 ENCOUNTER — Telehealth: Payer: Self-pay | Admitting: Family

## 2022-03-04 ENCOUNTER — Ambulatory Visit (INDEPENDENT_AMBULATORY_CARE_PROVIDER_SITE_OTHER): Payer: Medicaid Other | Admitting: Family

## 2022-03-04 VITALS — BP 106/73 | HR 73 | Temp 98.3°F | Resp 18 | Ht 64.96 in | Wt 137.0 lb

## 2022-03-04 DIAGNOSIS — Z1329 Encounter for screening for other suspected endocrine disorder: Secondary | ICD-10-CM

## 2022-03-04 DIAGNOSIS — Z124 Encounter for screening for malignant neoplasm of cervix: Secondary | ICD-10-CM

## 2022-03-04 DIAGNOSIS — Z13220 Encounter for screening for lipoid disorders: Secondary | ICD-10-CM | POA: Diagnosis not present

## 2022-03-04 DIAGNOSIS — Z114 Encounter for screening for human immunodeficiency virus [HIV]: Secondary | ICD-10-CM

## 2022-03-04 DIAGNOSIS — R399 Unspecified symptoms and signs involving the genitourinary system: Secondary | ICD-10-CM

## 2022-03-04 DIAGNOSIS — G8929 Other chronic pain: Secondary | ICD-10-CM

## 2022-03-04 DIAGNOSIS — Z1159 Encounter for screening for other viral diseases: Secondary | ICD-10-CM

## 2022-03-04 DIAGNOSIS — M545 Low back pain, unspecified: Secondary | ICD-10-CM

## 2022-03-04 DIAGNOSIS — Z789 Other specified health status: Secondary | ICD-10-CM

## 2022-03-04 DIAGNOSIS — Z131 Encounter for screening for diabetes mellitus: Secondary | ICD-10-CM

## 2022-03-04 DIAGNOSIS — Z7689 Persons encountering health services in other specified circumstances: Secondary | ICD-10-CM

## 2022-03-04 DIAGNOSIS — Z0000 Encounter for general adult medical examination without abnormal findings: Secondary | ICD-10-CM | POA: Diagnosis not present

## 2022-03-04 DIAGNOSIS — Z113 Encounter for screening for infections with a predominantly sexual mode of transmission: Secondary | ICD-10-CM

## 2022-03-04 DIAGNOSIS — R519 Headache, unspecified: Secondary | ICD-10-CM

## 2022-03-04 LAB — POCT URINALYSIS DIP (CLINITEK)
Glucose, UA: NEGATIVE mg/dL
Ketones, POC UA: NEGATIVE mg/dL
Leukocytes, UA: NEGATIVE
Nitrite, UA: NEGATIVE
POC PROTEIN,UA: 100 — AB
Spec Grav, UA: >=1.03 — AB (ref 1.010–1.025)
Urobilinogen, UA: 0.2 E.U./dL
pH, UA: 5.5 (ref 5.0–8.0)

## 2022-03-04 MED ORDER — IBUPROFEN 600 MG PO TABS
600.0000 mg | ORAL_TABLET | Freq: Three times a day (TID) | ORAL | 2 refills | Status: DC | PRN
Start: 2022-03-04 — End: 2022-12-27

## 2022-03-04 NOTE — Progress Notes (Signed)
Call patient with update.  ? ?No urinary tract infection. Patient reported to provider that she is currently on menses.

## 2022-03-04 NOTE — Telephone Encounter (Signed)
Pt asking about a referral for orthopedist please.  ?

## 2022-03-04 NOTE — Progress Notes (Signed)
Pt presents for annual physical exam and establish care ?Wants to know her next steps after the renal CT scan, states that she is still in pain  ?

## 2022-03-04 NOTE — Telephone Encounter (Signed)
Referral to Orthopedics complete.

## 2022-03-05 LAB — HEMOGLOBIN A1C
Est. average glucose Bld gHb Est-mCnc: 103 mg/dL
Hgb A1c MFr Bld: 5.2 % (ref 4.8–5.6)

## 2022-03-05 LAB — TSH: TSH: 1.71 u[IU]/mL (ref 0.450–4.500)

## 2022-03-05 LAB — HEPATITIS C ANTIBODY: Hep C Virus Ab: NONREACTIVE

## 2022-03-05 LAB — HIV ANTIBODY (ROUTINE TESTING W REFLEX): HIV Screen 4th Generation wRfx: NONREACTIVE

## 2022-03-05 NOTE — Progress Notes (Signed)
Call patient with update.  ? ?-  Thyroid function normal.  ?- No diabetes.  ?- Hepatitis C negative.  ?- HIV negative.  ? ?Camillia Herter, NP 03/05/2022 7:31 AM  ?

## 2022-03-24 ENCOUNTER — Ambulatory Visit
Admission: RE | Admit: 2022-03-24 | Discharge: 2022-03-24 | Disposition: A | Discharge status: Home / Self Care | Payer: No Typology Code available for payment source | Source: Ambulatory Visit | Attending: Obstetrics and Gynecology | Admitting: Obstetrics and Gynecology

## 2022-03-24 ENCOUNTER — Other Ambulatory Visit: Payer: Self-pay | Admitting: Obstetrics and Gynecology

## 2022-03-24 DIAGNOSIS — R7611 Nonspecific reaction to tuberculin skin test without active tuberculosis: Secondary | ICD-10-CM

## 2022-06-30 ENCOUNTER — Ambulatory Visit (INDEPENDENT_AMBULATORY_CARE_PROVIDER_SITE_OTHER): Payer: Medicaid Other | Admitting: Family Medicine

## 2022-06-30 VITALS — BP 94/68 | HR 75 | Temp 97.7°F | Resp 16 | Ht 64.0 in | Wt 139.0 lb

## 2022-06-30 DIAGNOSIS — Z789 Other specified health status: Secondary | ICD-10-CM | POA: Diagnosis not present

## 2022-06-30 DIAGNOSIS — N8110 Cystocele, unspecified: Secondary | ICD-10-CM | POA: Diagnosis not present

## 2022-06-30 DIAGNOSIS — L659 Nonscarring hair loss, unspecified: Secondary | ICD-10-CM

## 2022-06-30 DIAGNOSIS — Z7689 Persons encountering health services in other specified circumstances: Secondary | ICD-10-CM

## 2022-07-01 ENCOUNTER — Encounter: Payer: Self-pay | Admitting: Family Medicine

## 2022-07-01 LAB — CMP14+EGFR
ALT: 6 IU/L (ref 0–32)
AST: 10 IU/L (ref 0–40)
Albumin/Globulin Ratio: 1.6 (ref 1.2–2.2)
Albumin: 4.2 g/dL (ref 3.9–4.9)
Alkaline Phosphatase: 67 IU/L (ref 44–121)
BUN/Creatinine Ratio: 13 (ref 9–23)
BUN: 8 mg/dL (ref 6–24)
Bilirubin Total: 0.8 mg/dL (ref 0.0–1.2)
CO2: 22 mmol/L (ref 20–29)
Calcium: 9.6 mg/dL (ref 8.7–10.2)
Chloride: 103 mmol/L (ref 96–106)
Creatinine, Ser: 0.63 mg/dL (ref 0.57–1.00)
Globulin, Total: 2.6 g/dL (ref 1.5–4.5)
Glucose: 93 mg/dL (ref 70–99)
Potassium: 4.6 mmol/L (ref 3.5–5.2)
Sodium: 137 mmol/L (ref 134–144)
Total Protein: 6.8 g/dL (ref 6.0–8.5)
eGFR: 112 mL/min/{1.73_m2} (ref >59)

## 2022-07-01 LAB — CBC WITH DIFFERENTIAL/PLATELET
Basophils Absolute: 0 10*3/uL (ref 0.0–0.2)
Basos: 1 %
EOS (ABSOLUTE): 0.1 10*3/uL (ref 0.0–0.4)
Eos: 1 %
Hematocrit: 40.2 % (ref 34.0–46.6)
Hemoglobin: 13 g/dL (ref 11.1–15.9)
Immature Grans (Abs): 0 10*3/uL (ref 0.0–0.1)
Immature Granulocytes: 0 %
Lymphocytes Absolute: 1.9 10*3/uL (ref 0.7–3.1)
Lymphs: 33 %
MCH: 29.6 pg (ref 26.6–33.0)
MCHC: 32.3 g/dL (ref 31.5–35.7)
MCV: 92 fL (ref 79–97)
Monocytes Absolute: 0.4 10*3/uL (ref 0.1–0.9)
Monocytes: 6 %
Neutrophils Absolute: 3.4 10*3/uL (ref 1.4–7.0)
Neutrophils: 59 %
Platelets: 211 10*3/uL (ref 150–450)
RBC: 4.39 x10E6/uL (ref 3.77–5.28)
RDW: 12 % (ref 11.7–15.4)
WBC: 5.8 10*3/uL (ref 3.4–10.8)

## 2022-07-01 LAB — TSH: TSH: 2.01 u[IU]/mL (ref 0.450–4.500)

## 2022-07-01 NOTE — Progress Notes (Addendum)
New Patient Office Visit  Subjective    Patient ID: Marcia Miller, female    DOB: 01/19/1976  Age: 46 y.o. MRN: [id number]  CC: No chief complaint on file.   HPI Marcia Miller presents to establish care. Patient complains of hair loss and vaginal/uterine prolapse that is worsening. This visit was aided by an interpreter.    Outpatient Encounter Medications as of 06/30/2022  Medication Sig   aspirin-acetaminophen-caffeine (SM MIGRAINE RELIEF) 250-250-65 MG tablet Take 2 tablets by mouth every 8 (eight) hours as needed for headache.   budesonide-formoterol (SYMBICORT) 160-4.5 MCG/ACT inhaler Inhale 2 puffs into the lungs 2 (two) times daily.   cephALEXin (KEFLEX) 500 MG capsule Take 1 capsule (500 mg total) by mouth 2 (two) times daily.   cyclobenzaprine (FLEXERIL) 5 MG tablet Take 1 tablet (5 mg total) by mouth 3 (three) times daily as needed for muscle spasms.   ibuprofen (ADVIL) 600 MG tablet Take 1 tablet (600 mg total) by mouth every 8 (eight) hours as needed.   lansoprazole (PREVACID) 30 MG capsule Take 30 mg by mouth daily at 12 noon.   lidocaine (LIDODERM) 5 % Place 1 patch onto the skin daily. Remove & discard patch within 12 hours or as directed by MD   loratadine (CLARITIN) 5 MG chewable tablet Chew 5 mg by mouth daily.   omeprazole (PRILOSEC) 20 MG capsule Take 20 mg by mouth daily.   ondansetron (ZOFRAN-ODT) 4 MG disintegrating tablet Take 1 tablet (4 mg total) by mouth every 8 (eight) hours as needed for nausea or vomiting.   Phenir-PE-Sod Sal-Caff Cit (COUGH/COLD MEDICINE PO) Take by mouth.   No facility-administered encounter medications on file as of 06/30/2022.    Past Medical History:  Diagnosis Date   Asthma     No past surgical history on file.  Family History  Family history unknown: Yes    Social History   Socioeconomic History   Marital status: Married    Spouse name: Not on file   Number of children: Not on file   Years of  education: Not on file   Highest education level: Not on file  Occupational History   Not on file  Tobacco Use   Smoking status: Never    Passive exposure: Never   Smokeless tobacco: Never  Vaping Use   Vaping Use: Never used  Substance and Sexual Activity   Alcohol use: Never   Drug use: Never   Sexual activity: Yes    Birth control/protection: None  Other Topics Concern   Not on file  Social History Narrative   Not on file   Social Determinants of Health   Financial Resource Strain: Not on file  Food Insecurity: Not on file  Transportation Needs: Not on file  Physical Activity: Not on file  Stress: Not on file  Social Connections: Not on file  Intimate Partner Violence: Not on file    Review of Systems  All other systems reviewed and are negative.       Objective    BP 94/68   Pulse 75   Temp 97.7 F (36.5 C) (Oral)   Resp 16   Ht _0  (1.626 m)   Wt 139 lb (63 kg)   SpO2 98%   BMI 23.86 kg/m   Physical Exam Vitals and nursing note reviewed.  Constitutional:      General: She is not in acute distress. Cardiovascular:     Rate and Rhythm: Normal rate and regular  rhythm.  Pulmonary:     Effort: Pulmonary effort is normal.     Breath sounds: Normal breath sounds.  Genitourinary:    Comments: Deferred per patient Neurological:     General: No focal deficit present.     Mental Status: She is alert and oriented to person, place, and time.         Assessment & Plan:   1. Hair loss Labs ordered.  - CBC with Differential - TSH - CMP14+EGFR  2. Prolapse of anterior vaginal wall Referral to gyn for further eval/mgt - Ambulatory referral to Gynecology  3. Language barrier to communication   4. Encounter to establish care     Return in about 4 weeks (around 07/28/2022) for follow up hair loss.   Becky Sax, MD

## 2022-07-27 ENCOUNTER — Ambulatory Visit: Payer: Medicaid Other | Admitting: Family Medicine

## 2022-07-28 ENCOUNTER — Ambulatory Visit: Payer: Medicaid Other | Admitting: Family Medicine

## 2022-08-02 ENCOUNTER — Encounter: Payer: Self-pay | Admitting: Family Medicine

## 2022-08-02 ENCOUNTER — Ambulatory Visit (INDEPENDENT_AMBULATORY_CARE_PROVIDER_SITE_OTHER): Payer: Medicaid Other | Admitting: Family Medicine

## 2022-08-02 VITALS — BP 112/81 | HR 69 | Temp 98.1°F | Resp 16 | Wt 142.0 lb

## 2022-08-02 DIAGNOSIS — Z789 Other specified health status: Secondary | ICD-10-CM | POA: Diagnosis not present

## 2022-08-02 DIAGNOSIS — Z308 Encounter for other contraceptive management: Secondary | ICD-10-CM

## 2022-08-02 DIAGNOSIS — L659 Nonscarring hair loss, unspecified: Secondary | ICD-10-CM | POA: Diagnosis not present

## 2022-08-02 NOTE — Progress Notes (Signed)
Established Patient Office Visit  Subjective    Patient ID: Marcia Miller, female    DOB: [birth year]  Age: 46 y.o. MRN: [id number]  CC: No chief complaint on file.   HPI Marcia Miller presents for follow up of hair loss. Also complains of "internal inflammation" and wants a referral to gyn as well as IUD follow up.    Outpatient Encounter Medications as of 08/02/2022  Medication Sig   aspirin-acetaminophen-caffeine (SM MIGRAINE RELIEF) 250-250-65 MG tablet Take 2 tablets by mouth every 8 (eight) hours as needed for headache. (Patient not taking: Reported on 08/02/2022)   budesonide-formoterol (SYMBICORT) 160-4.5 MCG/ACT inhaler Inhale 2 puffs into the lungs 2 (two) times daily. (Patient not taking: Reported on 08/02/2022)   cephALEXin (KEFLEX) 500 MG capsule Take 1 capsule (500 mg total) by mouth 2 (two) times daily. (Patient not taking: Reported on 08/02/2022)   cyclobenzaprine (FLEXERIL) 5 MG tablet Take 1 tablet (5 mg total) by mouth 3 (three) times daily as needed for muscle spasms. (Patient not taking: Reported on 08/02/2022)   ibuprofen (ADVIL) 600 MG tablet Take 1 tablet (600 mg total) by mouth every 8 (eight) hours as needed. (Patient not taking: Reported on 08/02/2022)   lansoprazole (PREVACID) 30 MG capsule Take 30 mg by mouth daily at 12 noon. (Patient not taking: Reported on 08/02/2022)   lidocaine (LIDODERM) 5 % Place 1 patch onto the skin daily. Remove & discard patch within 12 hours or as directed by MD (Patient not taking: Reported on 08/02/2022)   loratadine (CLARITIN) 5 MG chewable tablet Chew 5 mg by mouth daily. (Patient not taking: Reported on 08/02/2022)   omeprazole (PRILOSEC) 20 MG capsule Take 20 mg by mouth daily. (Patient not taking: Reported on 08/02/2022)   ondansetron (ZOFRAN-ODT) 4 MG disintegrating tablet Take 1 tablet (4 mg total) by mouth every 8 (eight) hours as needed for nausea or vomiting. (Patient not taking: Reported on 08/02/2022)    Phenir-PE-Sod Sal-Caff Cit (COUGH/COLD MEDICINE PO) Take by mouth. (Patient not taking: Reported on 08/02/2022)   No facility-administered encounter medications on file as of 08/02/2022.    Past Medical History:  Diagnosis Date   Asthma     History reviewed. No pertinent surgical history.  Family History  Family history unknown: Yes    Social History   Socioeconomic History   Marital status: Married    Spouse name: Not on file   Number of children: Not on file   Years of education: Not on file   Highest education level: Not on file  Occupational History   Not on file  Tobacco Use   Smoking status: Never    Passive exposure: Never   Smokeless tobacco: Never  Vaping Use   Vaping Use: Never used  Substance and Sexual Activity   Alcohol use: Never   Drug use: Never   Sexual activity: Yes    Birth control/protection: None  Other Topics Concern   Not on file  Social History Narrative   Not on file   Social Determinants of Health   Financial Resource Strain: Not on file  Food Insecurity: Not on file  Transportation Needs: Not on file  Physical Activity: Not on file  Stress: Not on file  Social Connections: Not on file  Intimate Partner Violence: Not on file    Review of Systems  All other systems reviewed and are negative.       Objective    BP 112/81   Pulse 69  Temp 98.1 F (36.7 C) (Oral)   Resp 16   Wt 142 lb (64.4 kg)   SpO2 97%   BMI 24.37 kg/m   Physical Exam Vitals and nursing note reviewed.  Constitutional:      General: She is not in acute distress. Cardiovascular:     Rate and Rhythm: Normal rate and regular rhythm.  Pulmonary:     Effort: Pulmonary effort is normal.     Breath sounds: Normal breath sounds.  Abdominal:     Palpations: Abdomen is soft.     Tenderness: There is no abdominal tenderness.  Neurological:     General: No focal deficit present.     Mental Status: She is alert and oriented to person, place, and time.          Assessment & Plan:   1. Hair loss Referral to derm for further eval/mgt  - Ambulatory referral to Dermatology  2. Encounter for other contraceptive management Referral to gyn for further eval/mgt  - Ambulatory referral to Gynecology  3. Language barrier to communication   Return in about 3 months (around 11/02/2022) for follow up.   Becky Sax, MD

## 2022-08-18 ENCOUNTER — Ambulatory Visit: Payer: Medicaid Other

## 2022-10-04 ENCOUNTER — Other Ambulatory Visit (HOSPITAL_COMMUNITY)
Admission: RE | Admit: 2022-10-04 | Discharge: 2022-10-04 | Disposition: A | Discharge status: Home / Self Care | Payer: Medicaid Other | Source: Ambulatory Visit | Attending: Obstetrics and Gynecology | Admitting: Obstetrics and Gynecology

## 2022-10-04 ENCOUNTER — Ambulatory Visit (INDEPENDENT_AMBULATORY_CARE_PROVIDER_SITE_OTHER): Payer: Medicaid Other | Admitting: Family Medicine

## 2022-10-04 ENCOUNTER — Encounter: Payer: Self-pay | Admitting: Family Medicine

## 2022-10-04 VITALS — BP 105/75 | HR 70 | Ht 66.93 in | Wt 147.6 lb

## 2022-10-04 DIAGNOSIS — Z8611 Personal history of tuberculosis: Secondary | ICD-10-CM

## 2022-10-04 DIAGNOSIS — Z01419 Encounter for gynecological examination (general) (routine) without abnormal findings: Secondary | ICD-10-CM

## 2022-10-04 DIAGNOSIS — Z0000 Encounter for general adult medical examination without abnormal findings: Secondary | ICD-10-CM

## 2022-10-04 DIAGNOSIS — Z1231 Encounter for screening mammogram for malignant neoplasm of breast: Secondary | ICD-10-CM

## 2022-10-04 NOTE — Patient Instructions (Addendum)
Scheduled mammogram Benadryl at night for itching Follow up with the dermatologist Follow up with gynecologist

## 2022-10-04 NOTE — Progress Notes (Signed)
New patient presents to establish care/ annual exam. Pt has Paraguard IUD for contraception that was inserted in 2016. Pt reports never having a PAP. Last mammogram was approx 3 years ago. Pt also reports having a uterine prolapse that is causing pain and reoccurring infections. Pt on 3rd month of taking Rifampin medication for TB. Denies any symptoms. No other concerns at this time.

## 2022-10-04 NOTE — Progress Notes (Signed)
In person Arabic interpreter used during the entire encounter.   GYNECOLOGY OFFICE VISIT NOTE  History:   Marcia Miller is a 46 y.o. No obstetric history on file. here today to establish care. She is also concern about a possible pelvic floor prolapse. She states she was told this several years ago in her country [place]. She has done pelvic floor therapy and wanted to explore other options to improve this. She state is will cause rashes and urinary infections. She denies any abnormal vaginal discharge, dysuria, bleeding, and pelvic pain. She sees Dr. Andrey Campanile for her primary care.    Past Medical History:  Diagnosis Date   Asthma    Tuberculosis     History reviewed. No pertinent surgical history.  The following portions of the patient's history were reviewed and updated as appropriate: allergies, current medications, past family history, past medical history, past social history, past surgical history and problem list.   Health Maintenance: Due for pap today. Prior hx of paps not available. Will schedule mammogram.   Review of Systems:  Pertinent items noted in HPI and remainder of comprehensive ROS otherwise negative.  Physical Exam:  BP 105/75   Pulse 70   Ht 5' 6.93" (1.7 m)   Wt 67 kg   LMP 09/28/2022 (Exact Date)   BMI 23.17 kg/m  CONSTITUTIONAL: Well-developed, well-nourished female in no acute distress.  HEENT:  Normocephalic, atraumatic. External right and left ear normal. No scleral icterus.  NECK: Normal range of motion, supple, no masses noted on observation SKIN: No rash noted. Not diaphoretic. No erythema. No pallor. MUSCULOSKELETAL: Normal range of motion. No edema noted. NEUROLOGIC: Alert and oriented to person, place, and time. Normal muscle tone coordination. No cranial nerve deficit noted. PSYCHIATRIC: Normal mood and affect. Normal behavior. Normal judgment and thought content. CARDIOVASCULAR: Normal heart rate noted RESPIRATORY: Effort and  breath sounds normal, no problems with respiration noted ABDOMEN: No masses noted. No other overt distention noted.   PELVIC: Normal appearing external genitalia; normal urethral meatus; normal appearing vaginal mucosa and cervix.  No abnormal discharge noted.  Normal uterine size, no other palpable masses, no uterine or adnexal tenderness. Performed in the presence of a chaperone  Labs and Imaging Results for orders placed or performed in visit on 10/04/22 (from the past 168 hour(s))  Cervicovaginal ancillary only   Collection Time: 10/04/22  9:59 AM  Result Value Ref Range   Bacterial Vaginitis (gardnerella) Negative    Candida Vaginitis Negative    Candida Glabrata Negative    Comment      Normal Reference Range Bacterial Vaginosis - Negative   Comment Normal Reference Range Candida Species - Negative    Comment Normal Reference Range Candida Galbrata - Negative    No results found.    Assessment and Plan:    1. Women's annual routine gynecological examination 46 yo healthy appearing female. UTD on flu vaccine, consider tdap w/ PCP if indicated. The patient has concern about a what she believes is a chronic uterine prolapse. I have noted a normal exam but would prefer her to be evaluated with a gynecologist for further investigation given she has been referred for pelvic floor therapy in the past and has a stated history of recurrent urinary tract infections. She is asymptomatic today.  - Cytology - PAP( [facility]) - Cervicovaginal ancillary only  2. Encounter for screening mammogram for malignant neoplasm of breast - MM 3D SCREEN BREAST BILATERAL; Future  3. History of TB (tuberculosis)  On month 3 of treatment. Negative chest xray reviewed from 03/24/2022. Asymptomatic.   Routine preventative health maintenance measures emphasized. Please refer to After Visit Summary for other counseling recommendations.   Return in about 1 month (around 11/04/2022) for prolapse, schedule with Gyn  MD.    Lavonda Jumbo, DO OB Fellow, Faculty Practice [facility], Center for [facility] 10/06/2022, 9:52 AM

## 2022-10-05 LAB — CERVICOVAGINAL ANCILLARY ONLY
Bacterial Vaginitis (gardnerella): NEGATIVE
Candida Glabrata: NEGATIVE
Candida Vaginitis: NEGATIVE
Comment: NEGATIVE
Comment: NEGATIVE
Comment: NEGATIVE

## 2022-10-06 DIAGNOSIS — Z8611 Personal history of tuberculosis: Secondary | ICD-10-CM | POA: Insufficient documentation

## 2022-10-08 LAB — CYTOLOGY - PAP
Comment: NEGATIVE
Diagnosis: NEGATIVE
Diagnosis: REACTIVE
High risk HPV: NEGATIVE

## 2022-11-08 ENCOUNTER — Ambulatory Visit (INDEPENDENT_AMBULATORY_CARE_PROVIDER_SITE_OTHER): Payer: Medicaid Other | Admitting: Obstetrics and Gynecology

## 2022-11-08 ENCOUNTER — Ambulatory Visit (HOSPITAL_BASED_OUTPATIENT_CLINIC_OR_DEPARTMENT_OTHER)
Admission: RE | Admit: 2022-11-08 | Discharge: 2022-11-08 | Disposition: A | Discharge status: Home / Self Care | Payer: Medicaid Other | Source: Ambulatory Visit | Attending: Obstetrics and Gynecology | Admitting: Obstetrics and Gynecology

## 2022-11-08 ENCOUNTER — Other Ambulatory Visit (HOSPITAL_COMMUNITY)
Admission: RE | Admit: 2022-11-08 | Discharge: 2022-11-08 | Disposition: A | Discharge status: Home / Self Care | Payer: Medicaid Other | Source: Ambulatory Visit | Attending: Obstetrics and Gynecology | Admitting: Obstetrics and Gynecology

## 2022-11-08 ENCOUNTER — Encounter: Payer: Self-pay | Admitting: Obstetrics and Gynecology

## 2022-11-08 VITALS — BP 111/75 | HR 65 | Wt 149.0 lb

## 2022-11-08 DIAGNOSIS — N761 Subacute and chronic vaginitis: Secondary | ICD-10-CM | POA: Insufficient documentation

## 2022-11-08 DIAGNOSIS — Z1231 Encounter for screening mammogram for malignant neoplasm of breast: Secondary | ICD-10-CM | POA: Insufficient documentation

## 2022-11-08 DIAGNOSIS — R39198 Other difficulties with micturition: Secondary | ICD-10-CM | POA: Diagnosis not present

## 2022-11-08 LAB — POCT URINALYSIS DIPSTICK
Bilirubin, UA: NEGATIVE
Glucose, UA: NEGATIVE
Nitrite, UA: NEGATIVE
Protein, UA: NEGATIVE
Spec Grav, UA: 1.025 (ref 1.010–1.025)
Urobilinogen, UA: 0.2 E.U./dL
pH, UA: 5 (ref 5.0–8.0)

## 2022-11-08 MED ORDER — NYSTATIN-TRIAMCINOLONE 100000-0.1 UNIT/GM-% EX OINT: 1.0000 | TOPICAL_OINTMENT | Freq: Two times a day (BID) | CUTANEOUS | 0 refills | Status: DC

## 2022-11-08 NOTE — Progress Notes (Signed)
47 yo here for evaluation of pelvic organ prolapse. Patient reports a sensation of incomplete bladder emptying. She reports a long standing history of pelvic pressure as well. She was followed in [place] by a pelvic floor physical therapist without any improvement in her symptoms. She was told that she needed surgical interventions but never had a chance to follow up. Patient also reports today the presence of a yellow discharge with occasional vaginal pruritus. Patient reports some dyspareunia when rash is present. Patient is without any other complaints  Past Medical History:  Diagnosis Date   Asthma    Tuberculosis    No past surgical history on file. Family History  Family history unknown: Yes   Social History   Tobacco Use   Smoking status: Never    Passive exposure: Never   Smokeless tobacco: Never  Vaping Use   Vaping Use: Never used  Substance Use Topics   Alcohol use: Never   Drug use: Never   ROS See pertinent in HPI. All other systems reviewed and non contributory Blood pressure 111/75, pulse 65, weight 149 lb (67.6 kg), last menstrual period 10/27/2022. GENERAL: Well-developed, well-nourished female in no acute distress.  ABDOMEN: Soft, nontender, nondistended. No organomegaly. PELVIC: Normal external female genitalia. Vagina is pink and rugated.  Normal discharge. Normal appearing cervix. Uterus is normal in size. No adnexal mass or tenderness. Mild anterior wall prolapse. Chaperone present during the pelvic exam EXTREMITIES: No cyanosis, clubbing, or edema, 2+ distal pulses.   A/P 47 yo with pelvic pressure and urinary dysfunction - Vaginal swab collected to rule out BV or yeast infection - Rx Nystatin cream provided for occasional vulva pruritus - Patient referred to urogynecologist for further evaluation and management - Screening mammogram ordered - Interpreter present during the encounter

## 2022-11-08 NOTE — Progress Notes (Signed)
47 y.o GYN presents for Follow Up of Prolapse.  C/o yellow vaginal discharge, odor, burning, rashes on the inside and outside of vagina, pain during sex, dysuria.  Last PAP 10/04/22

## 2022-11-09 ENCOUNTER — Other Ambulatory Visit: Payer: Self-pay | Admitting: Obstetrics and Gynecology

## 2022-11-09 DIAGNOSIS — R928 Other abnormal and inconclusive findings on diagnostic imaging of breast: Secondary | ICD-10-CM

## 2022-11-09 LAB — CERVICOVAGINAL ANCILLARY ONLY
Bacterial Vaginitis (gardnerella): NEGATIVE
Candida Glabrata: NEGATIVE
Candida Vaginitis: NEGATIVE
Chlamydia: NEGATIVE
Comment: NEGATIVE
Comment: NEGATIVE
Comment: NEGATIVE
Comment: NEGATIVE
Comment: NEGATIVE
Comment: NORMAL
Neisseria Gonorrhea: NEGATIVE
Trichomonas: NEGATIVE

## 2022-11-10 LAB — URINE CULTURE

## 2022-11-11 ENCOUNTER — Ambulatory Visit (INDEPENDENT_AMBULATORY_CARE_PROVIDER_SITE_OTHER): Payer: Medicaid Other | Admitting: Family Medicine

## 2022-11-11 ENCOUNTER — Telehealth: Payer: Self-pay | Admitting: Family Medicine

## 2022-11-11 VITALS — BP 99/67 | HR 73 | Temp 98.1°F | Resp 16 | Wt 150.0 lb

## 2022-11-11 DIAGNOSIS — R053 Chronic cough: Secondary | ICD-10-CM

## 2022-11-11 DIAGNOSIS — L309 Dermatitis, unspecified: Secondary | ICD-10-CM | POA: Diagnosis not present

## 2022-11-11 DIAGNOSIS — L659 Nonscarring hair loss, unspecified: Secondary | ICD-10-CM

## 2022-11-11 DIAGNOSIS — K219 Gastro-esophageal reflux disease without esophagitis: Secondary | ICD-10-CM

## 2022-11-11 DIAGNOSIS — Z789 Other specified health status: Secondary | ICD-10-CM

## 2022-11-11 MED ORDER — TRIAMCINOLONE ACETONIDE 0.1 % EX CREA: 1.0000 | TOPICAL_CREAM | Freq: Two times a day (BID) | CUTANEOUS | 0 refills | Status: DC

## 2022-11-11 MED ORDER — HYDROXYZINE PAMOATE 25 MG PO CAPS: 25.0000 mg | ORAL_CAPSULE | Freq: Three times a day (TID) | ORAL | 1 refills | Status: DC | PRN

## 2022-11-11 MED ORDER — OMEPRAZOLE 20 MG PO CPDR: 20.0000 mg | DELAYED_RELEASE_CAPSULE | Freq: Every day | ORAL | 0 refills | Status: DC

## 2022-11-11 NOTE — Progress Notes (Signed)
Established Patient Office Visit  Subjective    Patient ID: Marcia Miller, female    DOB: 31-Dec-1975  Age: 47 y.o. MRN: [id number]  CC:  Chief Complaint  Patient presents with   Follow-up    HPI Marcia Miller presents to follow up on hair loss. Patient had appt with derm for sx but that appt was canceled 2/2 no interpreter. She may reschedule with an interpreter. Patient has also skin rash and itching. This visit was aided by an interpreter.    Outpatient Encounter Medications as of 11/11/2022  Medication Sig   hydrOXYzine (VISTARIL) 25 MG capsule Take 1 capsule (25 mg total) by mouth every 8 (eight) hours as needed.   triamcinolone cream (KENALOG) 0.1 % Apply 1 Application topically 2 (two) times daily.   aspirin-acetaminophen-caffeine (SM MIGRAINE RELIEF) 250-250-65 MG tablet Take 2 tablets by mouth every 8 (eight) hours as needed for headache. (Patient not taking: Reported on 08/02/2022)   budesonide-formoterol (SYMBICORT) 160-4.5 MCG/ACT inhaler Inhale 2 puffs into the lungs 2 (two) times daily. (Patient not taking: Reported on 08/02/2022)   cephALEXin (KEFLEX) 500 MG capsule Take 1 capsule (500 mg total) by mouth 2 (two) times daily. (Patient not taking: Reported on 08/02/2022)   cyclobenzaprine (FLEXERIL) 5 MG tablet Take 1 tablet (5 mg total) by mouth 3 (three) times daily as needed for muscle spasms. (Patient not taking: Reported on 08/02/2022)   ibuprofen (ADVIL) 600 MG tablet Take 1 tablet (600 mg total) by mouth every 8 (eight) hours as needed. (Patient not taking: Reported on 08/02/2022)   lansoprazole (PREVACID) 30 MG capsule Take 30 mg by mouth daily at 12 noon. (Patient not taking: Reported on 08/02/2022)   lidocaine (LIDODERM) 5 % Place 1 patch onto the skin daily. Remove & discard patch within 12 hours or as directed by MD (Patient not taking: Reported on 08/02/2022)   loratadine (CLARITIN) 5 MG chewable tablet Chew 5 mg by mouth daily. (Patient not taking:  Reported on 08/02/2022)   nystatin-triamcinolone ointment (MYCOLOG) Apply 1 Application topically 2 (two) times daily. (Patient not taking: Reported on 11/11/2022)   omeprazole (PRILOSEC) 20 MG capsule Take 1 capsule (20 mg total) by mouth daily.   ondansetron (ZOFRAN-ODT) 4 MG disintegrating tablet Take 1 tablet (4 mg total) by mouth every 8 (eight) hours as needed for nausea or vomiting. (Patient not taking: Reported on 08/02/2022)   Phenir-PE-Sod Sal-Caff Cit (COUGH/COLD MEDICINE PO) Take by mouth. (Patient not taking: Reported on 08/02/2022)   [DISCONTINUED] omeprazole (PRILOSEC) 20 MG capsule Take 20 mg by mouth daily. (Patient not taking: Reported on 08/02/2022)   No facility-administered encounter medications on file as of 11/11/2022.    Past Medical History:  Diagnosis Date   Asthma    Tuberculosis     No past surgical history on file.  Family History  Family history unknown: Yes    Social History   Socioeconomic History   Marital status: Married    Spouse name: Not on file   Number of children: Not on file   Years of education: Not on file   Highest education level: Not on file  Occupational History   Not on file  Tobacco Use   Smoking status: Never    Passive exposure: Never   Smokeless tobacco: Never  Vaping Use   Vaping Use: Never used  Substance and Sexual Activity   Alcohol use: Never   Drug use: Never   Sexual activity: Yes    Partners: Male  Birth control/protection: I.U.D.  Other Topics Concern   Not on file  Social History Narrative   Not on file   Social Determinants of Health   Financial Resource Strain: Not on file  Food Insecurity: Not on file  Transportation Needs: Not on file  Physical Activity: Not on file  Stress: Not on file  Social Connections: Not on file  Intimate Partner Violence: Not on file    Review of Systems  Skin:  Positive for itching and rash.  All other systems reviewed and are negative.       Objective    BP  99/67   Pulse 73   Temp 98.1 F (36.7 C) (Oral)   Resp 16   Wt 150 lb (68 kg)   LMP 10/27/2022 (Exact Date)   SpO2 98%   BMI 23.54 kg/m   Physical Exam Vitals and nursing note reviewed.  Constitutional:      General: She is not in acute distress. Cardiovascular:     Rate and Rhythm: Normal rate and regular rhythm.  Pulmonary:     Effort: Pulmonary effort is normal.     Breath sounds: Normal breath sounds.  Abdominal:     Palpations: Abdomen is soft.     Tenderness: There is no abdominal tenderness.  Skin:    Findings: Rash present.  Neurological:     General: No focal deficit present.     Mental Status: She is alert and oriented to person, place, and time.         Assessment & Plan:   1. Hair loss Referral to derm (with interpreter)  2. Chronic cough Referral to pulmonary for further eval/mgt - Ambulatory referral to Pulmonology  3. Dermatitis Triamcinolone and atarax prescribed. Referred to consultant as above  4. Gastroesophageal reflux disease without esophagitis Omeprazole prescribed  5. Language barrier to communication     Return in about 3 months (around 02/10/2023) for follow up.   Becky Sax, MD

## 2022-11-11 NOTE — Progress Notes (Signed)
Due to language barrier, an interpreter was used.  Interpreter name or [place] #--ELAF [id number], Reason for encounter--OV.  ~Glendi Mohiuddin E, RMA    Patient is here for their 3 month follow-up hair loss, seasonal allergy,GI pain with drinking Patient want referral to allergist or pulmonogist for cough Care gaps have been discussed with patient

## 2022-11-11 NOTE — Telephone Encounter (Signed)
Pt is checking out from her appt w/ Dr. Redmond Pulling now and states that she was going to be adding a referral for Derm but is inquiring about obtaining a different Dermatology referral than last time (not [place], [place], [place] [address]) since pt states they do not offer her language service at this location.

## 2022-11-15 ENCOUNTER — Encounter: Payer: Self-pay | Admitting: Family Medicine

## 2022-11-30 ENCOUNTER — Ambulatory Visit
Admission: RE | Admit: 2022-11-30 | Discharge: 2022-11-30 | Disposition: A | Discharge status: Home / Self Care | Payer: Medicaid Other | Source: Ambulatory Visit | Attending: Obstetrics and Gynecology | Admitting: Obstetrics and Gynecology

## 2022-11-30 ENCOUNTER — Ambulatory Visit: Payer: Medicaid Other

## 2022-11-30 DIAGNOSIS — R928 Other abnormal and inconclusive findings on diagnostic imaging of breast: Secondary | ICD-10-CM

## 2022-12-06 ENCOUNTER — Other Ambulatory Visit: Payer: Medicaid Other

## 2022-12-07 ENCOUNTER — Other Ambulatory Visit: Payer: Medicaid Other

## 2022-12-08 ENCOUNTER — Ambulatory Visit
Admission: RE | Admit: 2022-12-08 | Discharge: 2022-12-08 | Disposition: A | Discharge status: Home / Self Care | Payer: Medicaid Other | Source: Ambulatory Visit | Attending: Obstetrics and Gynecology | Admitting: Obstetrics and Gynecology

## 2022-12-08 DIAGNOSIS — R928 Other abnormal and inconclusive findings on diagnostic imaging of breast: Secondary | ICD-10-CM

## 2022-12-10 ENCOUNTER — Other Ambulatory Visit: Payer: Self-pay | Admitting: Family Medicine

## 2022-12-10 DIAGNOSIS — L309 Dermatitis, unspecified: Secondary | ICD-10-CM

## 2022-12-27 ENCOUNTER — Ambulatory Visit (INDEPENDENT_AMBULATORY_CARE_PROVIDER_SITE_OTHER): Payer: Medicaid Other | Admitting: Pulmonary Disease

## 2022-12-27 ENCOUNTER — Encounter: Payer: Self-pay | Admitting: Pulmonary Disease

## 2022-12-27 VITALS — BP 116/68 | HR 73 | Ht 67.0 in | Wt 145.0 lb

## 2022-12-27 DIAGNOSIS — J4520 Mild intermittent asthma, uncomplicated: Secondary | ICD-10-CM

## 2022-12-27 DIAGNOSIS — R053 Chronic cough: Secondary | ICD-10-CM | POA: Diagnosis not present

## 2022-12-27 DIAGNOSIS — K219 Gastro-esophageal reflux disease without esophagitis: Secondary | ICD-10-CM

## 2022-12-27 MED ORDER — PANTOPRAZOLE SODIUM 40 MG PO TBEC: 40.0000 mg | DELAYED_RELEASE_TABLET | Freq: Every day | ORAL | 6 refills | Status: DC

## 2022-12-27 MED ORDER — FLUTICASONE-SALMETEROL 100-50 MCG/ACT IN AEPB: 1.0000 | INHALATION_SPRAY | Freq: Two times a day (BID) | RESPIRATORY_TRACT | 6 refills | Status: DC

## 2022-12-27 NOTE — Patient Instructions (Addendum)
We will refer you to Gastroenterology for further evaluation of your heartburn and reflux  Start pantoprazole '40mg'$  daily, 30 minutes before breakfast with water on empty stomach  Start [place] inhaler 1 puff twice daily - rinse mouth out after each use  Follow up in 3 months with pulmonary function tests

## 2022-12-27 NOTE — Progress Notes (Unsigned)
Synopsis: Referred in February 2024 for chronic cough by Dorna Mai, MD  Subjective:   PATIENT ID: Marcia Miller DOB: [birth year], MRN: [id number]   HPI  Chief Complaint  Patient presents with   Consult    Referred by PCP for chronic cough for the past few months. Productive cough with clear phlegm. Slight increase in SOB but denies any wheezing.    Marcia Miller is a 47 year old woman, never smoker with history of tuberculosis and asthma who is referred to pulmonary clinic for chronic cough.   Cough for 7 years +mucous, yellow ? Sinus congestion or drainage She reports having cough for a few months per year Had cough this winter. Has been in [place]   Tirggers: strong perfumes, colognes, sanitizer, cleaning agents - lead to shortness of breath.   Cough is worse in the morning  Tried symbicort which helped in the past   She has stomach issues after eating, can get pain and nausea/vomiting.  She has issues for reflux.   No smoking. No second hand smoke.     Past Medical History:  Diagnosis Date   Asthma    Tuberculosis      Family History  Family history unknown: Yes     Social History   Socioeconomic History   Marital status: Married    Spouse name: Not on file   Number of children: Not on file   Years of education: Not on file   Highest education level: Not on file  Occupational History   Not on file  Tobacco Use   Smoking status: Never    Passive exposure: Never   Smokeless tobacco: Never  Vaping Use   Vaping Use: Never used  Substance and Sexual Activity   Alcohol use: Never   Drug use: Never   Sexual activity: Yes    Partners: Male    Birth control/protection: I.U.D.  Other Topics Concern   Not on file  Social History Narrative   Not on file   Social Determinants of Health   Financial Resource Strain: Not on file  Food Insecurity: Not on file  Transportation Needs: Not on file  Physical Activity: Not on file   Stress: Not on file  Social Connections: Not on file  Intimate Partner Violence: Not on file     No Known Allergies   Outpatient Medications Prior to Visit  Medication Sig Dispense Refill   budesonide-formoterol (SYMBICORT) 160-4.5 MCG/ACT inhaler Inhale 2 puffs into the lungs 2 (two) times daily.     lansoprazole (PREVACID) 30 MG capsule Take 30 mg by mouth daily at 12 noon.     levocetirizine (XYZAL) 5 MG tablet Take 5 mg by mouth every evening.     montelukast (SINGULAIR) 10 MG tablet Take 10 mg by mouth at bedtime.     triamcinolone cream (KENALOG) 0.1 % Apply 1 Application topically 2 (two) times daily. 80 g 0   aspirin-acetaminophen-caffeine (SM MIGRAINE RELIEF) 250-250-65 MG tablet Take 2 tablets by mouth every 8 (eight) hours as needed for headache. (Patient not taking: Reported on 08/02/2022) 50 tablet 0   budesonide-formoterol (SYMBICORT) 160-4.5 MCG/ACT inhaler Inhale 2 puffs into the lungs 2 (two) times daily. (Patient not taking: Reported on 08/02/2022)     cephALEXin (KEFLEX) 500 MG capsule Take 1 capsule (500 mg total) by mouth 2 (two) times daily. (Patient not taking: Reported on 08/02/2022) 14 capsule 0   cyclobenzaprine (FLEXERIL) 5 MG tablet Take 1 tablet (5 mg total)  by mouth 3 (three) times daily as needed for muscle spasms. (Patient not taking: Reported on 08/02/2022) 30 tablet 1   hydrOXYzine (VISTARIL) 25 MG capsule Take 1 capsule (25 mg total) by mouth every 8 (eight) hours as needed. 60 capsule 1   ibuprofen (ADVIL) 600 MG tablet Take 1 tablet (600 mg total) by mouth every 8 (eight) hours as needed. (Patient not taking: Reported on 08/02/2022) 30 tablet 2   lansoprazole (PREVACID) 30 MG capsule Take 30 mg by mouth daily at 12 noon. (Patient not taking: Reported on 08/02/2022)     lidocaine (LIDODERM) 5 % Place 1 patch onto the skin daily. Remove & discard patch within 12 hours or as directed by MD (Patient not taking: Reported on 08/02/2022) 15 patch 0   loratadine  (CLARITIN) 5 MG chewable tablet Chew 5 mg by mouth daily. (Patient not taking: Reported on 08/02/2022)     nystatin-triamcinolone ointment (MYCOLOG) Apply 1 Application topically 2 (two) times daily. (Patient not taking: Reported on 11/11/2022) 30 g 0   omeprazole (PRILOSEC) 20 MG capsule Take 1 capsule (20 mg total) by mouth daily. 90 capsule 0   ondansetron (ZOFRAN-ODT) 4 MG disintegrating tablet Take 1 tablet (4 mg total) by mouth every 8 (eight) hours as needed for nausea or vomiting. (Patient not taking: Reported on 08/02/2022) 20 tablet 0   Phenir-PE-Sod Sal-Caff Cit (COUGH/COLD MEDICINE PO) Take by mouth. (Patient not taking: Reported on 08/02/2022)     No facility-administered medications prior to visit.    ROS    Objective:   Vitals:   12/27/22 1034  BP: 116/68  Pulse: 73  SpO2: 100%  Weight: 145 lb (65.8 kg)  Height: '5\' 7"'$  (1.702 m)     Physical Exam    CBC    Component Value Date/Time   WBC 5.8 06/30/2022 0950   WBC 6.7 01/22/2022 1230   RBC 4.39 06/30/2022 0950   RBC 4.62 01/22/2022 1230   HGB 13.0 06/30/2022 0950   HCT 40.2 06/30/2022 0950   PLT 211 06/30/2022 0950   MCV 92 06/30/2022 0950   MCH 29.6 06/30/2022 0950   MCH 29.9 01/22/2022 1230   MCHC 32.3 06/30/2022 0950   MCHC 32.7 01/22/2022 1230   RDW 12.0 06/30/2022 0950   LYMPHSABS 1.9 06/30/2022 0950   EOSABS 0.1 06/30/2022 0950   BASOSABS 0.0 06/30/2022 0950      Latest Ref Rng & Units 06/30/2022    9:50 AM 01/22/2022   12:30 PM  BMP  Glucose 70 - 99 mg/dL 93  101   BUN 6 - 24 mg/dL 8  <5   Creatinine 0.57 - 1.00 mg/dL 0.63  0.63   BUN/Creat Ratio 9 - 23 13    Sodium 134 - 144 mmol/L 137  138   Potassium 3.5 - 5.2 mmol/L 4.6  3.8   Chloride 96 - 106 mmol/L 103  107   CO2 20 - 29 mmol/L 22  23   Calcium 8.7 - 10.2 mg/dL 9.6  10.2    Chest imaging: CXR 03/24/22 The heart size and mediastinal contours are within normal limits. Both lungs are clear. The visualized skeletal structures  are unremarkable.  PFT:     No data to display          Labs:  Path:  Echo:  Heart Catheterization:  Assessment & Plan:   Chronic cough  Gastroesophageal reflux disease without esophagitis - Plan: Ambulatory referral to Gastroenterology  Discussion: ***    Current Outpatient  Medications:    budesonide-formoterol (SYMBICORT) 160-4.5 MCG/ACT inhaler, Inhale 2 puffs into the lungs 2 (two) times daily., Disp: , Rfl:    lansoprazole (PREVACID) 30 MG capsule, Take 30 mg by mouth daily at 12 noon., Disp: , Rfl:    levocetirizine (XYZAL) 5 MG tablet, Take 5 mg by mouth every evening., Disp: , Rfl:    montelukast (SINGULAIR) 10 MG tablet, Take 10 mg by mouth at bedtime., Disp: , Rfl:    triamcinolone cream (KENALOG) 0.1 %, Apply 1 Application topically 2 (two) times daily., Disp: 80 g, Rfl: 0

## 2022-12-30 ENCOUNTER — Encounter: Payer: Self-pay | Admitting: Pulmonary Disease

## 2023-02-01 ENCOUNTER — Encounter: Payer: Self-pay | Admitting: Pulmonary Disease

## 2023-02-09 ENCOUNTER — Ambulatory Visit: Payer: Medicaid Other | Admitting: Family Medicine

## 2023-02-14 ENCOUNTER — Other Ambulatory Visit: Payer: Self-pay | Admitting: Family Medicine

## 2023-03-22 ENCOUNTER — Encounter: Payer: Self-pay | Admitting: Family Medicine

## 2023-03-22 ENCOUNTER — Ambulatory Visit (INDEPENDENT_AMBULATORY_CARE_PROVIDER_SITE_OTHER): Payer: Medicaid Other | Admitting: Family Medicine

## 2023-03-22 VITALS — BP 96/63 | HR 62 | Temp 98.0°F | Ht 67.0 in | Wt 147.0 lb

## 2023-03-22 DIAGNOSIS — L309 Dermatitis, unspecified: Secondary | ICD-10-CM

## 2023-03-22 DIAGNOSIS — Z789 Other specified health status: Secondary | ICD-10-CM

## 2023-03-22 MED ORDER — HYDROXYZINE HCL 25 MG PO TABS: 25.0000 mg | ORAL_TABLET | Freq: Three times a day (TID) | ORAL | 1 refills | Status: DC | PRN

## 2023-03-22 MED ORDER — TRIAMCINOLONE ACETONIDE 0.1 % EX CREA: 1.0000 | TOPICAL_CREAM | Freq: Two times a day (BID) | CUTANEOUS | 0 refills | Status: DC

## 2023-03-22 NOTE — Progress Notes (Unsigned)
Rash and itching on lower body, legs and feet

## 2023-03-22 NOTE — Progress Notes (Unsigned)
Established Patient Office Visit  Subjective    Patient ID: Marcia Miller, female    DOB: [birth year]  Age: 47 y.o. MRN: [id number]  CC:  Chief Complaint  Patient presents with   Rash   Pruritis    HPI Marcia Miller presents for complaint of rash and itching on legs for 4 days. Denies known contacts or exposures. This visit was aided by an interpreter.    Outpatient Encounter Medications as of 03/22/2023  Medication Sig   hydrOXYzine (ATARAX) 25 MG tablet Take 1 tablet (25 mg total) by mouth 3 (three) times daily as needed.   levocetirizine (XYZAL) 5 MG tablet Take 5 mg by mouth every evening.   montelukast (SINGULAIR) 10 MG tablet Take 10 mg by mouth at bedtime.   pantoprazole (PROTONIX) 40 MG tablet Take 1 tablet (40 mg total) by mouth daily.   triamcinolone cream (KENALOG) 0.1 % Apply 1 Application topically 2 (two) times daily.   triamcinolone cream (KENALOG) 0.1 % Apply 1 Application topically 2 (two) times daily.   fluticasone-salmeterol (WIXELA INHUB) 100-50 MCG/ACT AEPB Inhale 1 puff into the lungs 2 (two) times daily. (Patient not taking: Reported on 03/22/2023)   No facility-administered encounter medications on file as of 03/22/2023.    Past Medical History:  Diagnosis Date   Asthma    Tuberculosis     No past surgical history on file.  Family History  Family history unknown: Yes    Social History   Socioeconomic History   Marital status: Married    Spouse name: Not on file   Number of children: Not on file   Years of education: Not on file   Highest education level: Not on file  Occupational History   Not on file  Tobacco Use   Smoking status: Never    Passive exposure: Never   Smokeless tobacco: Never  Vaping Use   Vaping Use: Never used  Substance and Sexual Activity   Alcohol use: Never   Drug use: Never   Sexual activity: Yes    Partners: Male    Birth control/protection: I.U.D.  Other Topics Concern   Not on file   Social History Narrative   Not on file   Social Determinants of Health   Financial Resource Strain: Not on file  Food Insecurity: Not on file  Transportation Needs: Not on file  Physical Activity: Not on file  Stress: Not on file  Social Connections: Not on file  Intimate Partner Violence: Not on file    Review of Systems  Skin:  Positive for itching and rash.  All other systems reviewed and are negative.       Objective    BP 96/63   Pulse 62   Temp 98 F (36.7 C)   Ht 5\' 7"  (1.702 m)   Wt 147 lb (66.7 kg)   SpO2 98%   BMI 23.02 kg/m   Physical Exam Vitals and nursing note reviewed.  Constitutional:      General: She is not in acute distress. Cardiovascular:     Rate and Rhythm: Normal rate and regular rhythm.  Pulmonary:     Effort: Pulmonary effort is normal.     Breath sounds: Normal breath sounds.  Skin:    General: Skin is warm and dry.     Findings: Rash present.  Neurological:     General: No focal deficit present.     Mental Status: She is alert and oriented to person, place, and time.  Assessment & Plan:   1. Dermatitis Kenalog cream and hydroxyzine prescribed. Skin care discussed.   2. Language barrier to communication   Return if symptoms worsen or fail to improve.   Tommie Raymond, MD

## 2023-03-24 ENCOUNTER — Encounter: Payer: Self-pay | Admitting: Family Medicine

## 2023-04-01 NOTE — Progress Notes (Deleted)
Patient ID: Marcia Miller, female    DOB: April 28, 1976  MRN: [id number]  CC: Follow-Up  Subjective: Marcia Miller is a 47 y.o. female who presents for follow-up.  Her concerns today include:  Please get more details from patient.   Patient Active Problem List   Diagnosis Date Noted   History of TB (tuberculosis) 10/06/2022   Acute bilateral low back pain without sciatica 01/25/2022   GERD (gastroesophageal reflux disease) 01/20/2022     Current Outpatient Medications on File Prior to Visit  Medication Sig Dispense Refill   fluticasone-salmeterol (WIXELA INHUB) 100-50 MCG/ACT AEPB Inhale 1 puff into the lungs 2 (two) times daily. (Patient not taking: Reported on 03/22/2023) 60 each 6   hydrOXYzine (ATARAX) 25 MG tablet Take 1 tablet (25 mg total) by mouth 3 (three) times daily as needed. 60 tablet 1   levocetirizine (XYZAL) 5 MG tablet Take 5 mg by mouth every evening.     montelukast (SINGULAIR) 10 MG tablet Take 10 mg by mouth at bedtime.     pantoprazole (PROTONIX) 40 MG tablet Take 1 tablet (40 mg total) by mouth daily. 30 tablet 6   triamcinolone cream (KENALOG) 0.1 % Apply 1 Application topically 2 (two) times daily. 80 g 0   triamcinolone cream (KENALOG) 0.1 % Apply 1 Application topically 2 (two) times daily. 80 g 0   No current facility-administered medications on file prior to visit.    No Known Allergies  Social History   Socioeconomic History   Marital status: Married    Spouse name: Not on file   Number of children: Not on file   Years of education: Not on file   Highest education level: Not on file  Occupational History   Not on file  Tobacco Use   Smoking status: Never    Passive exposure: Never   Smokeless tobacco: Never  Vaping Use   Vaping Use: Never used  Substance and Sexual Activity   Alcohol use: Never   Drug use: Never   Sexual activity: Yes    Partners: Male    Birth control/protection: I.U.D.  Other Topics Concern   Not on  file  Social History Narrative   Not on file   Social Determinants of Health   Financial Resource Strain: Not on file  Food Insecurity: Not on file  Transportation Needs: Not on file  Physical Activity: Not on file  Stress: Not on file  Social Connections: Not on file  Intimate Partner Violence: Not on file    Family History  Family history unknown: Yes    No past surgical history on file.  ROS: Review of Systems Negative except as stated above  PHYSICAL EXAM: There were no vitals taken for this visit.  Physical Exam  {female adult master:310786} {female adult master:310785}     Latest Ref Rng & Units 06/30/2022    9:50 AM 01/22/2022   12:30 PM  CMP  Glucose 70 - 99 mg/dL 93  782   BUN 6 - 24 mg/dL 8  <5   Creatinine 9.56 - 1.00 mg/dL 2.13  0.86   Sodium 578 - 144 mmol/L 137  138   Potassium 3.5 - 5.2 mmol/L 4.6  3.8   Chloride 96 - 106 mmol/L 103  107   CO2 20 - 29 mmol/L 22  23   Calcium 8.7 - 10.2 mg/dL 9.6  46.9   Total Protein 6.0 - 8.5 g/dL 6.8  7.1   Total Bilirubin 0.0 - 1.2  mg/dL 0.8  1.7   Alkaline Phos 44 - 121 IU/L 67  56   AST 0 - 40 IU/L 10  17   ALT 0 - 32 IU/L 6  13    Lipid Panel  No results found for: "CHOL", "TRIG", "HDL", "CHOLHDL", "VLDL", "LDLCALC", "LDLDIRECT"  CBC    Component Value Date/Time   WBC 5.8 06/30/2022 0950   WBC 6.7 01/22/2022 1230   RBC 4.39 06/30/2022 0950   RBC 4.62 01/22/2022 1230   HGB 13.0 06/30/2022 0950   HCT 40.2 06/30/2022 0950   PLT 211 06/30/2022 0950   MCV 92 06/30/2022 0950   MCH 29.6 06/30/2022 0950   MCH 29.9 01/22/2022 1230   MCHC 32.3 06/30/2022 0950   MCHC 32.7 01/22/2022 1230   RDW 12.0 06/30/2022 0950   LYMPHSABS 1.9 06/30/2022 0950   EOSABS 0.1 06/30/2022 0950   BASOSABS 0.0 06/30/2022 0950    ASSESSMENT AND PLAN:  There are no diagnoses linked to this encounter.   Patient was given the opportunity to ask questions.  Patient verbalized understanding of the plan and was able to repeat  key elements of the plan. Patient was given clear instructions to go to Emergency Department or return to medical center if symptoms don't improve, worsen, or new problems develop.The patient verbalized understanding.   No orders of the defined types were placed in this encounter.    Requested Prescriptions    No prescriptions requested or ordered in this encounter    No follow-ups on file.  Rema Fendt, NP

## 2023-04-04 ENCOUNTER — Ambulatory Visit
Admission: EM | Admit: 2023-04-04 | Discharge: 2023-04-04 | Disposition: A | Discharge status: Home / Self Care | Payer: Medicaid Other

## 2023-04-04 ENCOUNTER — Ambulatory Visit: Payer: Medicaid Other | Admitting: Family

## 2023-04-08 NOTE — Progress Notes (Unsigned)
Patient ID: Marcia Miller, female    DOB: 1976/06/05  MRN: [id number]  CC: Referral   Subjective: Marcia Miller is a 47 y.o. female who presents for referral. She is accompanied by her husband.  Her concerns today include:  Last visit 03/22/2023 with Marcia Skeans, MD for management of dermatitis. Kenalog cream and Hydroxyzine prescribed at that time. Today patient reports rash persisting, current Kenlaog cream is ineffective, and Hydroxyzine is causing her to be "drowsy". States the itch is "unbearable". She would like to trial a different cream and requests referral to Dermatology. States she is frustrated because she has been waiting since her last office visit for referral to Dermatology. Reports at last office visit Marcia Skeans, MD told her to follow-up in 1 week if no improvement but her schedule was booked so she had to wait until today for a referral. States she is also frustrated because when she calls the office she speaks to an Scientist, research (life sciences)" and not the doctor. I discussed with patient in detail that a telemedicine visit would allow her to speak directly with Marcia Skeans, MD if she does not prefer to speak to office personnel when she calls. Also, I discussed with patient in detail that our office offers walk-in appointments if she would like to see Marcia Skeans, MD prior to scheduled appointments. Patient/patient's husband verbalized understanding and appreciative of my explanation. No further issues/concerns for discussion today.    Patient Active Problem List   Diagnosis Date Noted   History of TB (tuberculosis) 10/06/2022   Acute bilateral low back pain without sciatica 01/25/2022   GERD (gastroesophageal reflux disease) 01/20/2022     Current Outpatient Medications on File Prior to Visit  Medication Sig Dispense Refill   hydrOXYzine (ATARAX) 25 MG tablet Take 1 tablet (25 mg total) by mouth 3 (three) times daily as needed. 60 tablet 1   levocetirizine (XYZAL)  5 MG tablet Take 5 mg by mouth every evening.     montelukast (SINGULAIR) 10 MG tablet Take 10 mg by mouth at bedtime.     pantoprazole (PROTONIX) 40 MG tablet Take 1 tablet (40 mg total) by mouth daily. 30 tablet 6   fluticasone-salmeterol (WIXELA INHUB) 100-50 MCG/ACT AEPB Inhale 1 puff into the lungs 2 (two) times daily. (Patient not taking: Reported on 03/22/2023) 60 each 6   No current facility-administered medications on file prior to visit.    No Known Allergies  Social History   Socioeconomic History   Marital status: Married    Spouse name: Not on file   Number of children: Not on file   Years of education: Not on file   Highest education level: Not on file  Occupational History   Not on file  Tobacco Use   Smoking status: Never    Passive exposure: Never   Smokeless tobacco: Never  Vaping Use   Vaping Use: Never used  Substance and Sexual Activity   Alcohol use: Never   Drug use: Never   Sexual activity: Yes    Partners: Male    Birth control/protection: I.U.D.  Other Topics Concern   Not on file  Social History Narrative   Not on file   Social Determinants of Health   Financial Resource Strain: Not on file  Food Insecurity: Not on file  Transportation Needs: Not on file  Physical Activity: Not on file  Stress: Not on file  Social Connections: Not on file  Intimate Partner Violence: Not on file  Family History  Family history unknown: Yes    No past surgical history on file.  ROS: Review of Systems Negative except as stated above  PHYSICAL EXAM: BP 117/79 (BP Location: Left Arm, Patient Position: Sitting, Cuff Size: Normal)   Pulse 77   Temp 98.5 F (36.9 C)   Resp 12   Ht 5\' 7"  (1.702 m)   Wt 150 lb 12.8 oz (68.4 kg)   SpO2 99%   BMI 23.62 kg/m   Physical Exam HENT:     Head: Normocephalic and atraumatic.  Eyes:     Extraocular Movements: Extraocular movements intact.     Conjunctiva/sclera: Conjunctivae normal.     Pupils:  Pupils are equal, round, and reactive to light.  Cardiovascular:     Rate and Rhythm: Normal rate and regular rhythm.     Pulses: Normal pulses.     Heart sounds: Normal heart sounds.  Pulmonary:     Effort: Pulmonary effort is normal.     Breath sounds: Normal breath sounds.  Musculoskeletal:     Cervical back: Normal range of motion and neck supple.  Skin:    General: Skin is warm and dry.     Findings: Rash present.  Neurological:     General: No focal deficit present.     Mental Status: She is alert and oriented to person, place, and time.  Psychiatric:        Mood and Affect: Mood normal.        Behavior: Behavior normal.     ASSESSMENT AND PLAN: 1. Dermatitis - Triamcinolone cream discontinued due to ineffective.  - Trial Triamcinolone ointment as prescribed. Counseled on medication adherence/adverse effects. - Discussed with patient to trial Hydroxyzine prior to bedtime to curb side effect of drowsiness. - Referral to Dermatology for further evaluation/management. During the interim follow-up with primary provider in 4 weeks or sooner if needed.  - Ambulatory referral to Dermatology - triamcinolone ointment (KENALOG) 0.5 %; Apply 1 Application topically 2 (two) times daily.  Dispense: 60 g; Refill: 1  2. Language barrier - [employer]. Name: Marcia Miller  ID#:  [id number]   Patient was given the opportunity to ask questions.  Patient verbalized understanding of the plan and was able to repeat key elements of the plan. Patient was given clear instructions to go to Emergency Department or return to medical center if symptoms don't improve, worsen, or new problems develop.The patient verbalized understanding.   Orders Placed This Encounter  Procedures   Ambulatory referral to Dermatology     Requested Prescriptions   Signed Prescriptions Disp Refills   triamcinolone ointment (KENALOG) 0.5 % 60 g 1    Sig: Apply 1 Application topically 2 (two) times daily.     Return for Follow-Up or next available Marcia Skeans, MD.  Rema Fendt, NP

## 2023-04-12 ENCOUNTER — Ambulatory Visit: Payer: Medicaid Other | Admitting: Family

## 2023-04-12 ENCOUNTER — Encounter: Payer: Self-pay | Admitting: Family

## 2023-04-12 VITALS — BP 117/79 | HR 77 | Temp 98.5°F | Resp 12 | Ht 67.0 in | Wt 150.8 lb

## 2023-04-12 DIAGNOSIS — Z603 Acculturation difficulty: Secondary | ICD-10-CM | POA: Diagnosis not present

## 2023-04-12 DIAGNOSIS — L309 Dermatitis, unspecified: Secondary | ICD-10-CM

## 2023-04-12 DIAGNOSIS — Z758 Other problems related to medical facilities and other health care: Secondary | ICD-10-CM | POA: Diagnosis not present

## 2023-04-12 MED ORDER — TRIAMCINOLONE ACETONIDE 0.5 % EX OINT: 1.0000 | TOPICAL_OINTMENT | Freq: Two times a day (BID) | CUTANEOUS | 1 refills | Status: DC

## 2023-04-12 NOTE — Progress Notes (Signed)
Pt is here for referral   Requesting referral to dermatology    States the cream she was given at her last appt did not help at all, requesting new mediation for same until she can see the dermatologist

## 2023-04-22 ENCOUNTER — Other Ambulatory Visit: Payer: Self-pay | Admitting: Family Medicine

## 2023-05-09 NOTE — Congregational Nurse Program (Signed)
Patient reporting 4 days history of severe pain in her mouth radiating to ear and head on that side. PCP appointment made and communicated to patient. Attempting also to locate dentist who accepts [place] medicaid.

## 2023-05-11 ENCOUNTER — Encounter: Payer: Self-pay | Admitting: Family

## 2023-05-11 ENCOUNTER — Ambulatory Visit: Payer: Medicaid Other | Admitting: Family

## 2023-05-11 VITALS — BP 106/75 | HR 75 | Temp 98.5°F | Ht 65.0 in | Wt 149.4 lb

## 2023-05-11 DIAGNOSIS — K029 Dental caries, unspecified: Secondary | ICD-10-CM | POA: Diagnosis not present

## 2023-05-11 DIAGNOSIS — R053 Chronic cough: Secondary | ICD-10-CM

## 2023-05-11 DIAGNOSIS — L309 Dermatitis, unspecified: Secondary | ICD-10-CM

## 2023-05-11 DIAGNOSIS — Z0120 Encounter for dental examination and cleaning without abnormal findings: Secondary | ICD-10-CM | POA: Diagnosis not present

## 2023-05-11 DIAGNOSIS — Z758 Other problems related to medical facilities and other health care: Secondary | ICD-10-CM

## 2023-05-11 DIAGNOSIS — K002 Abnormalities of size and form of teeth: Secondary | ICD-10-CM

## 2023-05-11 DIAGNOSIS — Z603 Acculturation difficulty: Secondary | ICD-10-CM

## 2023-05-11 DIAGNOSIS — K0889 Other specified disorders of teeth and supporting structures: Secondary | ICD-10-CM | POA: Diagnosis not present

## 2023-05-11 MED ORDER — ACETAMINOPHEN 500 MG PO TABS
500.0000 mg | ORAL_TABLET | Freq: Four times a day (QID) | ORAL | 1 refills | Status: AC | PRN
Start: 2023-05-11 — End: ?

## 2023-05-11 MED ORDER — TRIAMCINOLONE ACETONIDE 0.5 % EX OINT: 1.0000 | TOPICAL_OINTMENT | Freq: Two times a day (BID) | CUTANEOUS | 1 refills | Status: DC

## 2023-05-11 MED ORDER — AMOXICILLIN-POT CLAVULANATE 875-125 MG PO TABS
1.0000 | ORAL_TABLET | Freq: Two times a day (BID) | ORAL | 0 refills | Status: DC
Start: 2023-05-11 — End: 2023-05-25

## 2023-05-11 NOTE — Progress Notes (Signed)
Patient ID: Marcia Miller, female    DOB: 1976-02-11  MRN: [id number]  CC: Toothache   Subjective: Marcia Miller is a 47 y.o. female who presents for toothache. She is accompanied by her son.  Her concerns today include:  - Right upper tooth pain. Pain radiating to eyes and ears. Causing discomfort with eating. Requests referral to Dentistry. - Needs Triamcinolone resent to [employer].  - Requests new referral to Dermatology. Reports current appointment not until March 2025.  - Nonproductive cough persisting. Denies associated red flag symptoms. Reports she was told in the past if cough continues to be seen by specialist.    Patient Active Problem List   Diagnosis Date Noted   History of TB (tuberculosis) 10/06/2022   Acute bilateral low back pain without sciatica 01/25/2022   GERD (gastroesophageal reflux disease) 01/20/2022     Current Outpatient Medications on File Prior to Visit  Medication Sig Dispense Refill   hydrOXYzine (ATARAX) 25 MG tablet Take 1 tablet (25 mg total) by mouth 3 (three) times daily as needed. 60 tablet 1   levocetirizine (XYZAL) 5 MG tablet Take 5 mg by mouth every evening.     montelukast (SINGULAIR) 10 MG tablet Take 10 mg by mouth at bedtime.     pantoprazole (PROTONIX) 40 MG tablet Take 1 tablet (40 mg total) by mouth daily. 30 tablet 6   fluticasone-salmeterol (WIXELA INHUB) 100-50 MCG/ACT AEPB Inhale 1 puff into the lungs 2 (two) times daily. (Patient not taking: Reported on 05/11/2023) 60 each 6   No current facility-administered medications on file prior to visit.    No Known Allergies  Social History   Socioeconomic History   Marital status: Married    Spouse name: Not on file   Number of children: Not on file   Years of education: Not on file   Highest education level: Not on file  Occupational History   Not on file  Tobacco Use   Smoking status: Never    Passive exposure: Never   Smokeless tobacco:  Never  Vaping Use   Vaping Use: Never used  Substance and Sexual Activity   Alcohol use: Never   Drug use: Never   Sexual activity: Yes    Partners: Male    Birth control/protection: I.U.D.  Other Topics Concern   Not on file  Social History Narrative   Not on file   Social Determinants of Health   Financial Resource Strain: Not on file  Food Insecurity: Not on file  Transportation Needs: Not on file  Physical Activity: Not on file  Stress: Not on file  Social Connections: Not on file  Intimate Partner Violence: Not on file    Family History  Family history unknown: Yes    No past surgical history on file.  ROS: Review of Systems Negative except as stated above  PHYSICAL EXAM: BP 106/75   Pulse 75   Temp 98.5 F (36.9 C) (Oral)   Ht 5\' 5"  (1.651 m)   Wt 149 lb 6.4 oz (67.8 kg)   SpO2 96%   BMI 24.86 kg/m   Physical Exam HENT:     Head: Normocephalic and atraumatic.     Nose: Nose normal.     Mouth/Throat:     Mouth: Mucous membranes are moist.     Dentition: Abnormal dentition. Dental caries present.     Pharynx: Oropharynx is clear.  Eyes:     Extraocular Movements: Extraocular movements intact.  Conjunctiva/sclera: Conjunctivae normal.     Pupils: Pupils are equal, round, and reactive to light.  Cardiovascular:     Rate and Rhythm: Normal rate and regular rhythm.     Pulses: Normal pulses.     Heart sounds: Normal heart sounds.  Pulmonary:     Effort: Pulmonary effort is normal.     Breath sounds: Normal breath sounds.  Musculoskeletal:     Cervical back: Normal range of motion and neck supple.  Neurological:     General: No focal deficit present.     Mental Status: She is alert and oriented to person, place, and time.  Psychiatric:        Mood and Affect: Mood normal.        Behavior: Behavior normal.     ASSESSMENT AND PLAN: 1. Toothache 2. Encounter for routine dental examination - Amoxicillin-Clavulanate and Acetaminophen as  prescribed. Counseled on medication adherence/adverse effects.  - Referral to Dentistry for further evaluation/management.  - Follow-up with primary provider as scheduled.  - Ambulatory referral to Dentistry - amoxicillin-clavulanate (AUGMENTIN) 875-125 MG tablet; Take 1 tablet by mouth 2 (two) times daily.  Dispense: 20 tablet; Refill: 0 - acetaminophen (TYLENOL) 500 MG tablet; Take 1 tablet (500 mg total) by mouth every 6 (six) hours as needed.  Dispense: 30 tablet; Refill: 1   3. Dermatitis - Triamcinolone ointment as prescribed. Counseled on medication adherence.  - Referral to Dermatology for further evaluation/management.  - Follow-up with primary provider as scheduled.  - triamcinolone ointment (KENALOG) 0.5 %; Apply 1 Application topically 2 (two) times daily.  Dispense: 60 g; Refill: 1 - Ambulatory referral to Dermatology  4. Chronic cough - Patient today in office with no cardiopulmonary/acute distress.  - Referral to Pulmonology for further evaluation/management.  - Follow-up with primary provider as scheduled.  - Ambulatory referral to Pulmonology  5. Language barrier - [employer]. Name: Marcia Miller  ID#: [id number]     Patient was given the opportunity to ask questions.  Patient verbalized understanding of the plan and was able to repeat key elements of the plan. Patient was given clear instructions to go to Emergency Department or return to medical center if symptoms don't improve, worsen, or new problems develop.The patient verbalized understanding.   Orders Placed This Encounter  Procedures   Ambulatory referral to Dentistry   Ambulatory referral to Dermatology   Ambulatory referral to Pulmonology     Requested Prescriptions   Signed Prescriptions Disp Refills   triamcinolone ointment (KENALOG) 0.5 % 60 g 1    Sig: Apply 1 Application topically 2 (two) times daily.   amoxicillin-clavulanate (AUGMENTIN) 875-125 MG tablet 20 tablet 0    Sig: Take 1 tablet by  mouth 2 (two) times daily.   acetaminophen (TYLENOL) 500 MG tablet 30 tablet 1    Sig: Take 1 tablet (500 mg total) by mouth every 6 (six) hours as needed.    Return for Follow-Up or next available Georganna Skeans, MD .  Rema Fendt, NP

## 2023-05-11 NOTE — Progress Notes (Signed)
Pt is stating her teeth are infected and she can not eat at all. States pain is radiating to eyes and ears. Right side is hurting due to the this.   Pt is having a cough and wants refferel to specialist for this.   Amy can you please resend the ointment to the Pharmacy on file. [facility] off [address]

## 2023-05-25 ENCOUNTER — Encounter: Payer: Self-pay | Admitting: Pulmonary Disease

## 2023-05-25 ENCOUNTER — Ambulatory Visit (INDEPENDENT_AMBULATORY_CARE_PROVIDER_SITE_OTHER): Payer: Medicaid Other | Admitting: Pulmonary Disease

## 2023-05-25 ENCOUNTER — Encounter: Payer: Self-pay | Admitting: Nurse Practitioner

## 2023-05-25 VITALS — BP 102/62 | HR 80 | Temp 97.8°F | Ht 65.0 in | Wt 150.6 lb

## 2023-05-25 DIAGNOSIS — R053 Chronic cough: Secondary | ICD-10-CM

## 2023-05-25 DIAGNOSIS — K219 Gastro-esophageal reflux disease without esophagitis: Secondary | ICD-10-CM

## 2023-05-25 DIAGNOSIS — R519 Headache, unspecified: Secondary | ICD-10-CM | POA: Diagnosis not present

## 2023-05-25 DIAGNOSIS — J4520 Mild intermittent asthma, uncomplicated: Secondary | ICD-10-CM

## 2023-05-25 MED ORDER — MONTELUKAST SODIUM 10 MG PO TABS
10.0000 mg | ORAL_TABLET | Freq: Every day | ORAL | 11 refills | Status: DC
Start: 2023-05-25 — End: 2023-06-29

## 2023-05-25 MED ORDER — LEVOCETIRIZINE DIHYDROCHLORIDE 5 MG PO TABS
5.0000 mg | ORAL_TABLET | Freq: Every evening | ORAL | 11 refills | Status: DC
Start: 2023-05-25 — End: 2023-06-29

## 2023-05-25 MED ORDER — FLUTICASONE-SALMETEROL 100-50 MCG/ACT IN AEPB
1.0000 | INHALATION_SPRAY | Freq: Two times a day (BID) | RESPIRATORY_TRACT | 11 refills | Status: DC
Start: 2023-05-25 — End: 2023-06-29

## 2023-05-25 MED ORDER — PANTOPRAZOLE SODIUM 40 MG PO TBEC: 40.0000 mg | DELAYED_RELEASE_TABLET | Freq: Every day | ORAL | 11 refills | Status: DC

## 2023-05-25 NOTE — Patient Instructions (Addendum)
Take pantoprazole 40mg  daily, 30 minutes before breakfast with water on empty stomach   Use Wixella inhaler 1 puff twice daily - rinse mouth out after each use   Take tylenol for your headache, as directed based on bottle  Recommend avoiding NSAIDs like aspirin, ibuprofen, motrin, aleve, advil

## 2023-05-25 NOTE — Progress Notes (Signed)
Synopsis: Referred in February 2024 for chronic cough by Georganna Skeans, MD  Subjective:   PATIENT ID: Marcia Miller GENDER: female DOB: Nov 12, 1975, MRN: [id number]  HPI  Chief Complaint  Patient presents with   Acute Visit    Pt c/o increased SOB and cough over the past wk. Her cough is prod with white sputum. She has had occ wheezing.    Marcia Miller is a 47 year old woman, never smoker with history of tuberculosis and asthma who returns to pulmonary clinic for chronic cough.   Initial OV 12/27/22 Arabic translator used throughout visit.  Patient reports intermittent cough over the past 7 years. The cough is productive with yellow mucous. She has chest tightness and shortness of breath with the cough. She has intermittent wheezing as well. She has symbicort inhaler which was prescribed in [place] which has helped her symptoms.They have recently moved to the [place] from [place]. Strong perfumes/colognes, hand sanitizer and certain cleaning agents can bother her breathing.   She has significant reflux symptoms and has been on PPI therapy in the past. She is concerned that the steroid in symbicort makes her reflux worse. She has stomach discomfort, nausea and vomiting intermittently with eating. Denies diarrhea. No issues with weight loss.   She is a never smoker and denies second hand smoke exposure. She lives with her husband and children.   Today 05/25/23 Her symptoms increased 10 days ago. She feels every 6 months her symptoms are recurring.  She is not currently taking any medications as discussed from last visit.  They report issues picking up medications prescribed at last visit.   She complains of frequent headache and would like neurology visit.   She has not been seen by GI for her reflux disease due to issues in scheduling an appointment.  Past Medical History:  Diagnosis Date   Asthma    Tuberculosis      Family History  Family history unknown: Yes      Social History   Socioeconomic History   Marital status: Married    Spouse name: Not on file   Number of children: Not on file   Years of education: Not on file   Highest education level: Not on file  Occupational History   Not on file  Tobacco Use   Smoking status: Never    Passive exposure: Never   Smokeless tobacco: Never  Vaping Use   Vaping status: Never Used  Substance and Sexual Activity   Alcohol use: Never   Drug use: Never   Sexual activity: Yes    Partners: Male    Birth control/protection: I.U.D.  Other Topics Concern   Not on file  Social History Narrative   Not on file   Social Determinants of Health   Financial Resource Strain: Not on file  Food Insecurity: Not on file  Transportation Needs: Not on file  Physical Activity: Not on file  Stress: Not on file  Social Connections: Not on file  Intimate Partner Violence: Not on file     No Known Allergies   Outpatient Medications Prior to Visit  Medication Sig Dispense Refill   acetaminophen (TYLENOL) 500 MG tablet Take 1 tablet (500 mg total) by mouth every 6 (six) hours as needed. (Patient not taking: Reported on 05/25/2023) 30 tablet 1   fluticasone-salmeterol (WIXELA INHUB) 100-50 MCG/ACT AEPB Inhale 1 puff into the lungs 2 (two) times daily. (Patient not taking: Reported on 05/25/2023) 60 each 6   hydrOXYzine (ATARAX)  25 MG tablet Take 1 tablet (25 mg total) by mouth 3 (three) times daily as needed. (Patient not taking: Reported on 05/25/2023) 60 tablet 1   levocetirizine (XYZAL) 5 MG tablet Take 5 mg by mouth every evening. (Patient not taking: Reported on 05/25/2023)     montelukast (SINGULAIR) 10 MG tablet Take 10 mg by mouth at bedtime. (Patient not taking: Reported on 05/25/2023)     pantoprazole (PROTONIX) 40 MG tablet Take 1 tablet (40 mg total) by mouth daily. (Patient not taking: Reported on 05/25/2023) 30 tablet 6   triamcinolone ointment (KENALOG) 0.5 % Apply 1 Application topically 2 (two) times  daily. (Patient not taking: Reported on 05/25/2023) 60 g 1   amoxicillin-clavulanate (AUGMENTIN) 875-125 MG tablet Take 1 tablet by mouth 2 (two) times daily. 20 tablet 0   No facility-administered medications prior to visit.    Review of Systems  Constitutional:  Negative for chills, fever, malaise/fatigue and weight loss.  HENT:  Negative for congestion, sinus pain and sore throat.   Eyes: Negative.   Respiratory:  Positive for cough, sputum production, shortness of breath and wheezing. Negative for hemoptysis.   Cardiovascular:  Negative for chest pain, palpitations, orthopnea, claudication and leg swelling.  Gastrointestinal:  Positive for heartburn and nausea. Negative for abdominal pain and vomiting.  Genitourinary: Negative.   Musculoskeletal:  Negative for joint pain and myalgias.  Skin:  Negative for rash.  Neurological:  Positive for headaches. Negative for weakness.  Endo/Heme/Allergies: Negative.   Psychiatric/Behavioral: Negative.      Objective:   Vitals:   05/25/23 1342  BP: 102/62  Pulse: 80  Temp: 97.8 F (36.6 C)  TempSrc: Oral  SpO2: 99%  Weight: 150 lb 9.6 oz (68.3 kg)  Height: 5\' 5"  (1.651 m)    Physical Exam Constitutional:      General: She is not in acute distress.    Appearance: She is not ill-appearing.     Comments: Coughing throughout exam  HENT:     Head: Normocephalic and atraumatic.  Eyes:     General: No scleral icterus.    Conjunctiva/sclera: Conjunctivae normal.  Cardiovascular:     Rate and Rhythm: Normal rate and regular rhythm.     Pulses: Normal pulses.     Heart sounds: Normal heart sounds. No murmur heard. Pulmonary:     Effort: Pulmonary effort is normal.     Breath sounds: Normal breath sounds. No wheezing, rhonchi or rales.  Musculoskeletal:     Right lower leg: No edema.     Left lower leg: No edema.  Skin:    General: Skin is warm and dry.  Neurological:     General: No focal deficit present.     Mental Status: She  is alert.    CBC    Component Value Date/Time   WBC 5.8 06/30/2022 0950   WBC 6.7 01/22/2022 1230   RBC 4.39 06/30/2022 0950   RBC 4.62 01/22/2022 1230   HGB 13.0 06/30/2022 0950   HCT 40.2 06/30/2022 0950   PLT 211 06/30/2022 0950   MCV 92 06/30/2022 0950   MCH 29.6 06/30/2022 0950   MCH 29.9 01/22/2022 1230   MCHC 32.3 06/30/2022 0950   MCHC 32.7 01/22/2022 1230   RDW 12.0 06/30/2022 0950   LYMPHSABS 1.9 06/30/2022 0950   EOSABS 0.1 06/30/2022 0950   BASOSABS 0.0 06/30/2022 0950      Latest Ref Rng & Units 06/30/2022    9:50 AM 01/22/2022   12:30 PM  BMP  Glucose 70 - 99 mg/dL 93  960   BUN 6 - 24 mg/dL 8  <5   Creatinine 4.54 - 1.00 mg/dL 0.98  1.19   BUN/Creat Ratio 9 - 23 13    Sodium 134 - 144 mmol/L 137  138   Potassium 3.5 - 5.2 mmol/L 4.6  3.8   Chloride 96 - 106 mmol/L 103  107   CO2 20 - 29 mmol/L 22  23   Calcium 8.7 - 10.2 mg/dL 9.6  14.7    Chest imaging: CXR 03/24/22 The heart size and mediastinal contours are within normal limits. Both lungs are clear. The visualized skeletal structures are unremarkable.  PFT:     No data to display          Labs:  Path:  Echo:  Heart Catheterization:  Assessment & Plan:   Mild intermittent asthma without complication  Gastroesophageal reflux disease without esophagitis  Chronic cough  Discussion: Marcia Miller is a 47 year old woman, never smoker with history of tuberculosis and asthma who returns to pulmonary clinic for chronic cough.   Her clinical history is concerning for asthma with triggers that include colognes/perfumes, hand sanitizer/cleaning agents and reflux disease.  She is to use wixella 100-60mcg 1 puff twice daily for asthma. She is to continue montelukast daily.   She is to take pantoprazole 40mg  daily, 30 minutes before breakfast for GERD.  I called the [facility] office today and scheduled patient an appointment for further evaluation.  We will refer her to neurology as  requested for history of headaches.  I recommended that she use Tylenol for headache instead of NSAIDs in case these are leading to increased gastritis.  Follow-up in 4 months  Melody Comas, MD [facility] Office: [phone]   Current Outpatient Medications:    acetaminophen (TYLENOL) 500 MG tablet, Take 1 tablet (500 mg total) by mouth every 6 (six) hours as needed. (Patient not taking: Reported on 05/25/2023), Disp: 30 tablet, Rfl: 1   fluticasone-salmeterol (WIXELA INHUB) 100-50 MCG/ACT AEPB, Inhale 1 puff into the lungs 2 (two) times daily. (Patient not taking: Reported on 05/25/2023), Disp: 60 each, Rfl: 6   hydrOXYzine (ATARAX) 25 MG tablet, Take 1 tablet (25 mg total) by mouth 3 (three) times daily as needed. (Patient not taking: Reported on 05/25/2023), Disp: 60 tablet, Rfl: 1   levocetirizine (XYZAL) 5 MG tablet, Take 5 mg by mouth every evening. (Patient not taking: Reported on 05/25/2023), Disp: , Rfl:    montelukast (SINGULAIR) 10 MG tablet, Take 10 mg by mouth at bedtime. (Patient not taking: Reported on 05/25/2023), Disp: , Rfl:    pantoprazole (PROTONIX) 40 MG tablet, Take 1 tablet (40 mg total) by mouth daily. (Patient not taking: Reported on 05/25/2023), Disp: 30 tablet, Rfl: 6   triamcinolone ointment (KENALOG) 0.5 %, Apply 1 Application topically 2 (two) times daily. (Patient not taking: Reported on 05/25/2023), Disp: 60 g, Rfl: 1

## 2023-06-05 ENCOUNTER — Encounter: Payer: Self-pay | Admitting: Pulmonary Disease

## 2023-06-10 ENCOUNTER — Telehealth: Payer: Self-pay | Admitting: Pulmonary Disease

## 2023-06-10 MED ORDER — PREDNISONE 10 MG PO TABS: ORAL_TABLET | ORAL | 0 refills | Status: DC

## 2023-06-10 MED ORDER — ALBUTEROL SULFATE HFA 108 (90 BASE) MCG/ACT IN AERS: 2.0000 | INHALATION_SPRAY | Freq: Four times a day (QID) | RESPIRATORY_TRACT | 1 refills | Status: DC | PRN

## 2023-06-10 NOTE — Telephone Encounter (Signed)
Pt states she hasn't felt any better still Sob

## 2023-06-10 NOTE — Telephone Encounter (Signed)
I will send in Albuterol inhaler she can use every 4-6 hours for shortness of breath/wheezing. Try prednisone 20mg  x 7 days. Follow-up first available with JD

## 2023-06-10 NOTE — Telephone Encounter (Signed)
Spoke to patient via interpreter service(ID [phone]).  Patient stated that sx have not improved with Wixela and pantoprazole that was prescribed on 05/25/2023. She reports of persistent dry cough and SOB with exertion. Sx have worsened over the past past. Denied f/c/s or additional sx.  She is using Wixela BID. She does not have albuterol.  Advised patient to present to ED if sx worsen.   Beth, please advise. JD is unavailable.

## 2023-06-13 ENCOUNTER — Encounter: Payer: Self-pay | Admitting: Pulmonary Disease

## 2023-06-13 ENCOUNTER — Ambulatory Visit (INDEPENDENT_AMBULATORY_CARE_PROVIDER_SITE_OTHER): Payer: Medicaid Other | Admitting: Pulmonary Disease

## 2023-06-13 ENCOUNTER — Ambulatory Visit (INDEPENDENT_AMBULATORY_CARE_PROVIDER_SITE_OTHER): Payer: Medicaid Other

## 2023-06-13 VITALS — BP 110/66 | HR 97 | Temp 98.4°F | Ht 65.0 in | Wt 150.8 lb

## 2023-06-13 DIAGNOSIS — J4520 Mild intermittent asthma, uncomplicated: Secondary | ICD-10-CM

## 2023-06-13 DIAGNOSIS — R053 Chronic cough: Secondary | ICD-10-CM

## 2023-06-13 DIAGNOSIS — K219 Gastro-esophageal reflux disease without esophagitis: Secondary | ICD-10-CM

## 2023-06-13 DIAGNOSIS — J0100 Acute maxillary sinusitis, unspecified: Secondary | ICD-10-CM

## 2023-06-13 MED ORDER — FLUTICASONE PROPIONATE 50 MCG/ACT NA SUSP
1.0000 | Freq: Every day | NASAL | 2 refills | Status: DC
Start: 2023-06-13 — End: 2023-08-11

## 2023-06-13 MED ORDER — AMOXICILLIN-POT CLAVULANATE 875-125 MG PO TABS: 1.0000 | ORAL_TABLET | Freq: Two times a day (BID) | ORAL | 0 refills | Status: DC

## 2023-06-13 NOTE — Telephone Encounter (Signed)
Spoke to patient's husband, Alhasan(DPR)via interpreter (ID [phone]) and relayed below message/recommendations/ Alhasan would like an appt to discuss medication and sx. Appt scheduled for today at 11:00. Nothing further needed.

## 2023-06-13 NOTE — Progress Notes (Signed)
Synopsis: Referred in February 2024 for chronic cough by Georganna Skeans, MD  Subjective:   PATIENT ID: Marcia Miller GENDER: female DOB: 08-Aug-1976, MRN: [id number]  HPI  Chief Complaint  Patient presents with   Follow-up    SOB    Marcia Miller is a 47 year old woman, never smoker with history of tuberculosis and asthma who returns to pulmonary clinic for chronic cough.   Initial OV 12/27/22 Arabic translator used throughout visit.  Patient reports intermittent cough over the past 7 years. The cough is productive with yellow mucous. She has chest tightness and shortness of breath with the cough. She has intermittent wheezing as well. She has symbicort inhaler which was prescribed in [place] which has helped her symptoms.They have recently moved to the [place] from [place]. Strong perfumes/colognes, hand sanitizer and certain cleaning agents can bother her breathing.   She has significant reflux symptoms and has been on PPI therapy in the past. She is concerned that the steroid in symbicort makes her reflux worse. She has stomach discomfort, nausea and vomiting intermittently with eating. Denies diarrhea. No issues with weight loss.   She is a never smoker and denies second hand smoke exposure. She lives with her husband and children.   OV 05/25/23 Her symptoms increased 10 days ago. She feels every 6 months her symptoms are recurring.  She is not currently taking any medications as discussed from last visit.  They report issues picking up medications prescribed at last visit.   She complains of frequent headache and would like neurology visit.   She has not been seen by GI for her reflux disease due to issues in scheduling an appointment.  Today OV 06/13/23 She returns today for acute visit due to increased cough, postnasal drainage and sinus pressure/pain and headache.  She was started on Wixela inhaler 1 puff twice daily, fluticasone nasal spray and pantoprazole 40 mg  daily at last visit and reports she does not have much improvement in her cough symptoms.  Past Medical History:  Diagnosis Date   Asthma    Tuberculosis      Family History  Family history unknown: Yes     Social History   Socioeconomic History   Marital status: Married    Spouse name: Not on file   Number of children: Not on file   Years of education: Not on file   Highest education level: Not on file  Occupational History   Not on file  Tobacco Use   Smoking status: Never    Passive exposure: Never   Smokeless tobacco: Never  Vaping Use   Vaping status: Never Used  Substance and Sexual Activity   Alcohol use: Never   Drug use: Never   Sexual activity: Yes    Partners: Male    Birth control/protection: I.U.D.  Other Topics Concern   Not on file  Social History Narrative   Not on file   Social Determinants of Health   Financial Resource Strain: Not on file  Food Insecurity: Not on file  Transportation Needs: Not on file  Physical Activity: Not on file  Stress: Not on file  Social Connections: Not on file  Intimate Partner Violence: Not on file     No Known Allergies   Outpatient Medications Prior to Visit  Medication Sig Dispense Refill   acetaminophen (TYLENOL) 500 MG tablet Take 1 tablet (500 mg total) by mouth every 6 (six) hours as needed. 30 tablet 1   albuterol (VENTOLIN  HFA) 108 (90 Base) MCG/ACT inhaler Inhale 2 puffs into the lungs every 6 (six) hours as needed for wheezing or shortness of breath. 8 g 1   fluticasone-salmeterol (WIXELA INHUB) 100-50 MCG/ACT AEPB Inhale 1 puff into the lungs 2 (two) times daily. 60 each 11   levocetirizine (XYZAL) 5 MG tablet Take 1 tablet (5 mg total) by mouth every evening. 30 tablet 11   montelukast (SINGULAIR) 10 MG tablet Take 1 tablet (10 mg total) by mouth at bedtime. 30 tablet 11   pantoprazole (PROTONIX) 40 MG tablet Take 1 tablet (40 mg total) by mouth daily. 30 tablet 11   predniSONE (DELTASONE) 10 MG  tablet Take 2 tablets daily x 1 week 14 tablet 0   triamcinolone ointment (KENALOG) 0.5 % Apply 1 Application topically 2 (two) times daily. 60 g 1   No facility-administered medications prior to visit.    Review of Systems  Constitutional:  Negative for chills, fever, malaise/fatigue and weight loss.  HENT:  Positive for congestion and sinus pain. Negative for sore throat.   Eyes: Negative.   Respiratory:  Positive for cough, sputum production, shortness of breath and wheezing. Negative for hemoptysis.   Cardiovascular:  Negative for chest pain, palpitations, orthopnea, claudication and leg swelling.  Gastrointestinal:  Positive for heartburn and nausea. Negative for abdominal pain and vomiting.  Genitourinary: Negative.   Musculoskeletal:  Negative for joint pain and myalgias.  Skin:  Negative for rash.  Neurological:  Positive for headaches. Negative for weakness.  Endo/Heme/Allergies: Negative.   Psychiatric/Behavioral: Negative.      Objective:   There were no vitals filed for this visit.   Physical Exam Constitutional:      General: She is not in acute distress.    Appearance: She is not ill-appearing.     Comments: Coughing throughout exam  HENT:     Head: Normocephalic and atraumatic.     Nose: Mucosal edema and congestion present.     Left Turbinates: Swollen.     Left Sinus: Maxillary sinus tenderness and frontal sinus tenderness present.  Eyes:     General: No scleral icterus.    Conjunctiva/sclera: Conjunctivae normal.  Cardiovascular:     Rate and Rhythm: Normal rate and regular rhythm.     Pulses: Normal pulses.     Heart sounds: Normal heart sounds. No murmur heard. Pulmonary:     Effort: Pulmonary effort is normal.     Breath sounds: Normal breath sounds. No wheezing, rhonchi or rales.  Musculoskeletal:     Right lower leg: No edema.     Left lower leg: No edema.  Skin:    General: Skin is warm and dry.  Neurological:     General: No focal deficit  present.     Mental Status: She is alert.    CBC    Component Value Date/Time   WBC 5.8 06/30/2022 0950   WBC 6.7 01/22/2022 1230   RBC 4.39 06/30/2022 0950   RBC 4.62 01/22/2022 1230   HGB 13.0 06/30/2022 0950   HCT 40.2 06/30/2022 0950   PLT 211 06/30/2022 0950   MCV 92 06/30/2022 0950   MCH 29.6 06/30/2022 0950   MCH 29.9 01/22/2022 1230   MCHC 32.3 06/30/2022 0950   MCHC 32.7 01/22/2022 1230   RDW 12.0 06/30/2022 0950   LYMPHSABS 1.9 06/30/2022 0950   EOSABS 0.1 06/30/2022 0950   BASOSABS 0.0 06/30/2022 0950      Latest Ref Rng & Units 06/30/2022  9:50 AM 01/22/2022   12:30 PM  BMP  Glucose 70 - 99 mg/dL 93  130   BUN 6 - 24 mg/dL 8  <5   Creatinine 8.65 - 1.00 mg/dL 7.84  6.96   BUN/Creat Ratio 9 - 23 13    Sodium 134 - 144 mmol/L 137  138   Potassium 3.5 - 5.2 mmol/L 4.6  3.8   Chloride 96 - 106 mmol/L 103  107   CO2 20 - 29 mmol/L 22  23   Calcium 8.7 - 10.2 mg/dL 9.6  29.5    Chest imaging: CXR 03/24/22 The heart size and mediastinal contours are within normal limits. Both lungs are clear. The visualized skeletal structures are unremarkable.  PFT:     No data to display          Labs:  Path:  Echo:  Heart Catheterization:  Assessment & Plan:   No diagnosis found.  Discussion: Marcia Miller is a 47 year old woman, never smoker with history of tuberculosis and asthma who returns to pulmonary clinic for chronic cough.   Her clinical history is concerning for asthma with triggers that include colognes/perfumes, hand sanitizer/cleaning agents and reflux disease.  She currently has acute sinusitis of the left frontal and maxillary sinuses based on exam.  She is to start Augmentin 1 tab twice daily for 10 days and prednisone 20 mg daily for 7 days.  She is to use wixella 100-43mcg 1 puff twice daily for asthma. She is to continue montelukast daily.   She is to take pantoprazole 40mg  daily, 30 minutes before breakfast for GERD.  She has  follow-up with [facility] for further evaluation  Chest radiograph today is unremarkable without infiltrates, opacities or pleural effusions.  Follow-up as previously scheduled.  Melody Comas, MD  Pulmonary & Critical Care Office: [phone]   Current Outpatient Medications:    acetaminophen (TYLENOL) 500 MG tablet, Take 1 tablet (500 mg total) by mouth every 6 (six) hours as needed., Disp: 30 tablet, Rfl: 1   albuterol (VENTOLIN HFA) 108 (90 Base) MCG/ACT inhaler, Inhale 2 puffs into the lungs every 6 (six) hours as needed for wheezing or shortness of breath., Disp: 8 g, Rfl: 1   fluticasone-salmeterol (WIXELA INHUB) 100-50 MCG/ACT AEPB, Inhale 1 puff into the lungs 2 (two) times daily., Disp: 60 each, Rfl: 11   levocetirizine (XYZAL) 5 MG tablet, Take 1 tablet (5 mg total) by mouth every evening., Disp: 30 tablet, Rfl: 11   montelukast (SINGULAIR) 10 MG tablet, Take 1 tablet (10 mg total) by mouth at bedtime., Disp: 30 tablet, Rfl: 11   pantoprazole (PROTONIX) 40 MG tablet, Take 1 tablet (40 mg total) by mouth daily., Disp: 30 tablet, Rfl: 11   predniSONE (DELTASONE) 10 MG tablet, Take 2 tablets daily x 1 week, Disp: 14 tablet, Rfl: 0   triamcinolone ointment (KENALOG) 0.5 %, Apply 1 Application topically 2 (two) times daily., Disp: 60 g, Rfl: 1

## 2023-06-13 NOTE — Patient Instructions (Addendum)
I am treating you for acute sinus infection  Take augmentin (antibiotic) 1 tablet twice daily for 10 days  Take prednisone 2 tablets in the morning for 7 days  Take pantoprazole 40mg  daily, 30 minutes before breakfast with water on empty stomach   Use fluticasone nasal spray, 1 spray per nostril daily  Use Wixella inhaler 1 puff twice daily - rinse mouth out after each use  We will check chest x-ray today  Follow up as scheduled

## 2023-06-14 ENCOUNTER — Ambulatory Visit: Payer: Medicaid Other | Admitting: Family Medicine

## 2023-06-20 ENCOUNTER — Encounter: Payer: Self-pay | Admitting: Pulmonary Disease

## 2023-06-29 ENCOUNTER — Ambulatory Visit (INDEPENDENT_AMBULATORY_CARE_PROVIDER_SITE_OTHER): Payer: Medicaid Other

## 2023-06-29 ENCOUNTER — Encounter: Payer: Self-pay | Admitting: Family Medicine

## 2023-06-29 ENCOUNTER — Ambulatory Visit (INDEPENDENT_AMBULATORY_CARE_PROVIDER_SITE_OTHER): Payer: Medicaid Other | Admitting: Family Medicine

## 2023-06-29 VITALS — BP 107/80 | HR 88 | Temp 98.1°F | Resp 16 | Wt 150.0 lb

## 2023-06-29 DIAGNOSIS — Z789 Other specified health status: Secondary | ICD-10-CM

## 2023-06-29 DIAGNOSIS — R053 Chronic cough: Secondary | ICD-10-CM

## 2023-06-29 DIAGNOSIS — J4520 Mild intermittent asthma, uncomplicated: Secondary | ICD-10-CM | POA: Diagnosis not present

## 2023-06-29 DIAGNOSIS — K219 Gastro-esophageal reflux disease without esophagitis: Secondary | ICD-10-CM | POA: Diagnosis not present

## 2023-06-29 DIAGNOSIS — N8110 Cystocele, unspecified: Secondary | ICD-10-CM

## 2023-06-29 MED ORDER — MONTELUKAST SODIUM 10 MG PO TABS
10.0000 mg | ORAL_TABLET | Freq: Every day | ORAL | 11 refills | Status: DC
Start: 2023-06-29 — End: 2023-08-11

## 2023-06-29 MED ORDER — FLUTICASONE-SALMETEROL 250-50 MCG/ACT IN AEPB: 1.0000 | INHALATION_SPRAY | Freq: Two times a day (BID) | RESPIRATORY_TRACT | 3 refills | Status: DC

## 2023-06-29 MED ORDER — LEVOCETIRIZINE DIHYDROCHLORIDE 5 MG PO TABS
5.0000 mg | ORAL_TABLET | Freq: Every evening | ORAL | 11 refills | Status: DC
Start: 2023-06-29 — End: 2023-08-11

## 2023-06-29 MED ORDER — PANTOPRAZOLE SODIUM 40 MG PO TBEC
40.0000 mg | DELAYED_RELEASE_TABLET | Freq: Every day | ORAL | 11 refills | Status: DC
Start: 2023-06-29 — End: 2023-08-30

## 2023-06-29 NOTE — Progress Notes (Unsigned)
Patient is here for their 3 month follow-up Care gaps have been discussed with patient   Patient has concerns about her coughing and asthma. Patient is using inhaler but she said it's not working for her.

## 2023-06-30 ENCOUNTER — Encounter: Payer: Self-pay | Admitting: Family Medicine

## 2023-06-30 NOTE — Progress Notes (Signed)
Established Patient Office Visit  Subjective    Patient ID: Marcia Miller, female    DOB: 01/01/1976  Age: 47 y.o. MRN: [id number]  CC:  Chief Complaint  Patient presents with   Follow-up    HPI Marcia Miller presents for follow up of chronic med issues. This visit was aided by an interpreter.    Outpatient Encounter Medications as of 06/29/2023  Medication Sig   fluticasone-salmeterol (ADVAIR DISKUS) 250-50 MCG/ACT AEPB Inhale 1 puff into the lungs in the morning and at bedtime.   acetaminophen (TYLENOL) 500 MG tablet Take 1 tablet (500 mg total) by mouth every 6 (six) hours as needed.   albuterol (VENTOLIN HFA) 108 (90 Base) MCG/ACT inhaler Inhale 2 puffs into the lungs every 6 (six) hours as needed for wheezing or shortness of breath.   fluticasone (FLONASE) 50 MCG/ACT nasal spray Place 1 spray into both nostrils daily.   levocetirizine (XYZAL) 5 MG tablet Take 1 tablet (5 mg total) by mouth every evening.   montelukast (SINGULAIR) 10 MG tablet Take 1 tablet (10 mg total) by mouth at bedtime.   pantoprazole (PROTONIX) 40 MG tablet Take 1 tablet (40 mg total) by mouth daily.   [DISCONTINUED] amoxicillin-clavulanate (AUGMENTIN) 875-125 MG tablet Take 1 tablet by mouth 2 (two) times daily.   [DISCONTINUED] fluticasone-salmeterol (WIXELA INHUB) 100-50 MCG/ACT AEPB Inhale 1 puff into the lungs 2 (two) times daily.   [DISCONTINUED] levocetirizine (XYZAL) 5 MG tablet Take 1 tablet (5 mg total) by mouth every evening.   [DISCONTINUED] montelukast (SINGULAIR) 10 MG tablet Take 1 tablet (10 mg total) by mouth at bedtime.   [DISCONTINUED] pantoprazole (PROTONIX) 40 MG tablet Take 1 tablet (40 mg total) by mouth daily.   [DISCONTINUED] predniSONE (DELTASONE) 10 MG tablet Take 2 tablets daily x 1 week   [DISCONTINUED] triamcinolone ointment (KENALOG) 0.5 % Apply 1 Application topically 2 (two) times daily.   No facility-administered encounter medications on file as of  06/29/2023.    Past Medical History:  Diagnosis Date   Asthma    Tuberculosis     No past surgical history on file.  Family History  Family history unknown: Yes    Social History   Socioeconomic History   Marital status: Married    Spouse name: Not on file   Number of children: Not on file   Years of education: Not on file   Highest education level: Not on file  Occupational History   Not on file  Tobacco Use   Smoking status: Never    Passive exposure: Never   Smokeless tobacco: Never  Vaping Use   Vaping status: Never Used  Substance and Sexual Activity   Alcohol use: Never   Drug use: Never   Sexual activity: Yes    Partners: Male    Birth control/protection: I.U.D.  Other Topics Concern   Not on file  Social History Narrative   Not on file   Social Determinants of Health   Financial Resource Strain: Not on file  Food Insecurity: Not on file  Transportation Needs: Not on file  Physical Activity: Not on file  Stress: Not on file  Social Connections: Not on file  Intimate Partner Violence: Not on file    Review of Systems  All other systems reviewed and are negative.       Objective    BP 107/80   Pulse 88   Temp 98.1 F (36.7 C) (Oral)   Resp 16   Wt 150  lb (68 kg)   SpO2 97%   BMI 24.96 kg/m   Physical Exam Vitals and nursing note reviewed.  Constitutional:      General: She is not in acute distress. Cardiovascular:     Rate and Rhythm: Normal rate and regular rhythm.  Pulmonary:     Effort: Pulmonary effort is normal.     Breath sounds: Normal breath sounds.  Abdominal:     Palpations: Abdomen is soft.     Tenderness: There is no abdominal tenderness.  Neurological:     General: No focal deficit present.     Mental Status: She is alert and oriented to person, place, and time.         Assessment & Plan:   1. Persistent cough Referred for CXR - DG Chest 1 View; Future  2. Gastroesophageal reflux disease without  esophagitis Continue. Keep scheduled appt with consultant for further eval/mgt - pantoprazole (PROTONIX) 40 MG tablet; Take 1 tablet (40 mg total) by mouth daily.  Dispense: 30 tablet; Refill: 11  3. Mild intermittent asthma without complication Keep scheduled appt with consultant - montelukast (SINGULAIR) 10 MG tablet; Take 1 tablet (10 mg total) by mouth at bedtime.  Dispense: 30 tablet; Refill: 11 - levocetirizine (XYZAL) 5 MG tablet; Take 1 tablet (5 mg total) by mouth every evening.  Dispense: 30 tablet; Refill: 11  4. Prolapse of anterior vaginal wall Referred for continued management per consultant - Ambulatory referral to Obstetrics / Gynecology  5. Language barrier to communication     Return in about 3 months (around 09/29/2023) for follow up.   Tommie Raymond, MD

## 2023-08-10 ENCOUNTER — Ambulatory Visit (INDEPENDENT_AMBULATORY_CARE_PROVIDER_SITE_OTHER): Payer: Medicaid Other | Admitting: Nurse Practitioner

## 2023-08-10 ENCOUNTER — Other Ambulatory Visit: Payer: Medicaid Other

## 2023-08-10 ENCOUNTER — Encounter: Payer: Self-pay | Admitting: Nurse Practitioner

## 2023-08-10 VITALS — BP 100/76 | HR 60 | Ht 65.0 in | Wt 150.0 lb

## 2023-08-10 DIAGNOSIS — J45909 Unspecified asthma, uncomplicated: Secondary | ICD-10-CM | POA: Diagnosis not present

## 2023-08-10 DIAGNOSIS — K219 Gastro-esophageal reflux disease without esophagitis: Secondary | ICD-10-CM

## 2023-08-10 LAB — COMPREHENSIVE METABOLIC PANEL
ALT: 5 U/L (ref 0–35)
AST: 10 U/L (ref 0–37)
Albumin: 4.1 g/dL (ref 3.5–5.2)
Alkaline Phosphatase: 43 U/L (ref 39–117)
BUN: 8 mg/dL (ref 6–23)
CO2: 24 meq/L (ref 19–32)
Calcium: 9.3 mg/dL (ref 8.4–10.5)
Chloride: 106 meq/L (ref 96–112)
Creatinine, Ser: 0.56 mg/dL (ref 0.40–1.20)
GFR: 110.37 mL/min (ref >60.00)
Glucose, Bld: 91 mg/dL (ref 70–99)
Potassium: 4 meq/L (ref 3.5–5.1)
Sodium: 138 meq/L (ref 135–145)
Total Bilirubin: 1.1 mg/dL (ref 0.2–1.2)
Total Protein: 6.6 g/dL (ref 6.0–8.3)

## 2023-08-10 LAB — CBC WITH DIFFERENTIAL/PLATELET
Basophils Absolute: 0 10*3/uL (ref 0.0–0.1)
Basophils Relative: 0.6 % (ref 0.0–3.0)
Eosinophils Absolute: 0.1 10*3/uL (ref 0.0–0.7)
Eosinophils Relative: 1.2 % (ref 0.0–5.0)
HCT: 40.2 % (ref 36.0–46.0)
Hemoglobin: 13 g/dL (ref 12.0–15.0)
Lymphocytes Relative: 31.6 % (ref 12.0–46.0)
Lymphs Abs: 1.7 10*3/uL (ref 0.7–4.0)
MCHC: 32.5 g/dL (ref 30.0–36.0)
MCV: 89.8 fL (ref 78.0–100.0)
Monocytes Absolute: 0.4 10*3/uL (ref 0.1–1.0)
Monocytes Relative: 7.6 % (ref 3.0–12.0)
Neutro Abs: 3.1 10*3/uL (ref 1.4–7.7)
Neutrophils Relative %: 59 % (ref 43.0–77.0)
Platelets: 219 10*3/uL (ref 150.0–400.0)
RBC: 4.47 Mil/uL (ref 3.87–5.11)
RDW: 13.4 % (ref 11.5–15.5)
WBC: 5.2 10*3/uL (ref 4.0–10.5)

## 2023-08-10 MED ORDER — PANTOPRAZOLE SODIUM 40 MG PO TBEC: 40.0000 mg | DELAYED_RELEASE_TABLET | Freq: Two times a day (BID) | ORAL | 1 refills | Status: DC

## 2023-08-10 NOTE — Patient Instructions (Addendum)
??? ?? ????? ???? ?????? ???????. ???? ????? ????????? ???????? ??????? ?? ????? ?????? ?????.  ??? ??? ?????? ????? ????????? (??? ??? ?????? ???)? ????? ??????? ??? ?? ??? ???????.   ___________________________________________  ?????? ???? 4-6 ???? ??????? ??? ????? ???????.   ??? ?????? ??????? ??????? ??? ???????? ?????? ?? ?????? ?? ?????? ????? ?? ????? ???? ??????: ??????????? 40 ???   ??? ??? ???? ?????? ????? ?? ?? ???? ??? ?????? ?????? ????? ?? ??????? ??? ???????? ?????. ???? ??? "B" ?? ??????. ??? ??????? ??? ????? ????? ??? ?????? ??? ????? ?? ??????.   Due to recent changes in healthcare laws, you may see the results of your imaging and laboratory studies on MyChart before your provider has had a chance to review them.  We understand that in some cases there may be results that are confusing or concerning to you. Not all laboratory results come back in the same time frame and the provider may be waiting for multiple results in order to interpret others.  Please give [place] 48 hours in order for your provider to thoroughly review all the results before contacting the office for clarification of your results.   Thank you for trusting me with your gastrointestinal care!   Alcide Evener, CRNP

## 2023-08-10 NOTE — Progress Notes (Signed)
08/10/2023 Caliana Mohamad Marando [id number] 1975/11/16   CHIEF COMPLAINT: Heartburn, cough  HISTORY OF PRESENT ILLNESS: Ellason M. 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 No surgeries. She speaks Arabic therefore she presents accompanied by a [place] Arabic interpretor to facilitate communication. Her husband is also present. She presents to our office today as referred by pulmonologist Dr. Melody Comas for further evaluation regarding GERD. She has a history of asthma and a chronic cough x seven years for which she takes Flonase nasal spray, Advair inhaler, albuterol inhaler, Xyzal and Singulair as prescribed by her pulmonologist. Last took Prednisone 2 years ago. Her cough is triggered by perfumes scents. No specific food triggers Cough is notably worse in the morning. She has heartburn almost daily for many years. She was recently took Pantoprazole 40mg  every day x 2 months then stopped taking it 10 days ago as she did not think she needed it as her cough persisted but her heartburn improved. She denies having any dysphagia. No upper or lower abdominal pain. She is passing a normal formed brown bowel movement every day or every other day. No rectal bleeding or black stools. Never had an upper endoscopy or colonoscopy. No known family history of upper GI or colorectal cancer.  She endorses having frequent headaches.  No NSAID use.     Latest Ref Rng & Units 06/30/2022    9:50 AM 01/22/2022   12:30 PM  CBC  WBC 3.4 - 10.8 x10E3/uL 5.8  6.7   Hemoglobin 11.1 - 15.9 g/dL 40.9  81.1   Hematocrit 34.0 - 46.6 % 40.2  42.2   Platelets 150 - 450 x10E3/uL 211  220        Latest Ref Rng & Units 06/30/2022    9:50 AM 01/22/2022   12:30 PM  CMP  Glucose 70 - 99 mg/dL 93  914   BUN 6 - 24 mg/dL 8  <5   Creatinine 7.82 - 1.00 mg/dL 9.56  2.13    Sodium 086 - 144 mmol/L 137  138   Potassium 3.5 - 5.2 mmol/L 4.6  3.8   Chloride 96 - 106 mmol/L 103  107   CO2 20 - 29 mmol/L 22  23   Calcium 8.7 - 10.2 mg/dL 9.6  57.8   Total Protein 6.0 - 8.5 g/dL 6.8  7.1   Total Bilirubin 0.0 - 1.2 mg/dL 0.8  1.7   Alkaline Phos 44 - 121 IU/L 67  56   AST 0 - 40 IU/L 10  17   ALT 0 - 32 IU/L 6  13     Social History: She is married.  She has 3 sons and 1 daughter.  Non-smoker.  No alcohol use.  No drug use.  Family History:  No known family history or esophageal, gastric or colon cancer. Mother had stomach problems, no cancer.   No Known Allergies    Outpatient Encounter Medications as of 08/10/2023  Medication Sig   acetaminophen (TYLENOL) 500 MG tablet Take 1 tablet (500 mg total) by mouth every 6 (six) hours as needed.   albuterol (VENTOLIN HFA) 108 (90 Base) MCG/ACT inhaler Inhale 2 puffs into the lungs every 6 (six) hours as needed for wheezing or shortness of breath.   fluticasone (FLONASE) 50 MCG/ACT nasal spray Place 1 spray into both nostrils daily.  fluticasone-salmeterol (ADVAIR DISKUS) 250-50 MCG/ACT AEPB Inhale 1 puff into the lungs in the morning and at bedtime.   levocetirizine (XYZAL) 5 MG tablet Take 1 tablet (5 mg total) by mouth every evening.   montelukast (SINGULAIR) 10 MG tablet Take 1 tablet (10 mg total) by mouth at bedtime.   pantoprazole (PROTONIX) 40 MG tablet Take 1 tablet (40 mg total) by mouth daily.   No facility-administered encounter medications on file as of 08/10/2023.    REVIEW OF SYSTEMS:  Gen: Denies fever, sweats or chills. No weight loss.  CV: Denies chest pain, palpitations or edema. Resp: Denies cough, shortness of breath of hemoptysis.  GI: See HPI. GU: Denies urinary burning, blood in urine, increased urinary frequency or incontinence. MS: Denies joint pain, muscles aches or weakness. Derm: Denies rash, itchiness, skin lesions or unhealing ulcers. Psych: Denies depression, anxiety, memory  loss or confusion. Heme: Denies bruising, easy bleeding. Neuro:  + Headaches. Endo:  Denies any problems with DM, thyroid or adrenal function.  PHYSICAL EXAM: BP 100/76   Pulse 60   Ht 5\' 5"  (1.651 m)   Wt 150 lb (68 kg)   BMI 24.96 kg/m   General: 47 year old female in no acute distress. Head: Normocephalic and atraumatic. Eyes:  Sclerae non-icteric, conjunctive pink. Ears: Normal auditory acuity. Mouth: Dentition intact. No ulcers or lesions. No oral thrush.  Neck: Supple, no lymphadenopathy or thyromegaly.  Lungs: Clear bilaterally to auscultation without wheezes, crackles or rhonchi. Heart: Regular rate and rhythm. No murmur, rub or gallop appreciated.  Abdomen: Soft, nondistended. Mild epigastric, RUQ and central lower abdominal tenderness without rebound or guarding.  No masses. No hepatosplenomegaly. Normoactive bowel sounds x 4 quadrants.  Rectal: Deferred.  Musculoskeletal: Symmetrical with no gross deformities. Skin: Warm and dry. No rash or lesions on visible extremities. Extremities: No edema. Neurological: Alert oriented x 4, no focal deficits.  Psychological:  Alert and cooperative. Normal mood and affect.  ASSESSMENT AND PLAN:  47 year old female with GERD and a chronic cough. No significant improvement after taking Pantoprazole 40mg  every day x 2 months. Past Prednisone and Flonase nasal spray.  -EGD benefits and risks discussed including risk with sedation, risk of bleeding, perforation and infection  -GERD diet handout -Pantoprazole 40 mg p.o. 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   CC:  Georganna Skeans, MD

## 2023-08-10 NOTE — Progress Notes (Signed)
I agree with the assessment and plan as outlined by Ms. Kennedy-Smith. 

## 2023-08-11 ENCOUNTER — Encounter: Payer: Self-pay | Admitting: Family Medicine

## 2023-08-11 ENCOUNTER — Ambulatory Visit: Payer: Medicaid Other | Admitting: Family Medicine

## 2023-08-11 VITALS — BP 104/72 | HR 65 | Temp 98.5°F | Resp 16 | Ht 65.0 in | Wt 150.0 lb

## 2023-08-11 DIAGNOSIS — J0100 Acute maxillary sinusitis, unspecified: Secondary | ICD-10-CM

## 2023-08-11 DIAGNOSIS — J4520 Mild intermittent asthma, uncomplicated: Secondary | ICD-10-CM

## 2023-08-11 DIAGNOSIS — Z789 Other specified health status: Secondary | ICD-10-CM | POA: Diagnosis not present

## 2023-08-11 DIAGNOSIS — Z3009 Encounter for other general counseling and advice on contraception: Secondary | ICD-10-CM

## 2023-08-11 MED ORDER — MONTELUKAST SODIUM 10 MG PO TABS
10.0000 mg | ORAL_TABLET | Freq: Every day | ORAL | 3 refills | Status: DC
Start: 2023-08-11 — End: 2024-07-31

## 2023-08-11 MED ORDER — FLUTICASONE PROPIONATE 50 MCG/ACT NA SUSP: 1.0000 | Freq: Every day | NASAL | 2 refills | Status: DC

## 2023-08-11 MED ORDER — FLUTICASONE-SALMETEROL 250-50 MCG/ACT IN AEPB: 1.0000 | INHALATION_SPRAY | Freq: Two times a day (BID) | RESPIRATORY_TRACT | 5 refills | Status: DC

## 2023-08-11 MED ORDER — LEVOCETIRIZINE DIHYDROCHLORIDE 5 MG PO TABS
5.0000 mg | ORAL_TABLET | Freq: Every evening | ORAL | 1 refills | Status: DC
Start: 2023-08-11 — End: 2024-07-31

## 2023-08-11 MED ORDER — FLUTICASONE PROPIONATE 50 MCG/ACT NA SUSP
1.0000 | Freq: Every day | NASAL | 2 refills | Status: DC
Start: 2023-08-11 — End: 2024-07-31

## 2023-08-15 ENCOUNTER — Encounter: Payer: Self-pay | Admitting: Family Medicine

## 2023-08-15 NOTE — Progress Notes (Signed)
Established Patient Office Visit  Subjective    Patient ID: Marcia Miller, female    DOB: 22-Dec-1975  Age: 47 y.o. MRN: [id number]  CC:  Chief Complaint  Patient presents with   Follow-up    Allergy medication     HPI Marcia Miller presents for follow up of chronic med issues. This visit was aided by an interpreter. Patient also reports that she would like to get her IUD removed/replaced.   Outpatient Encounter Medications as of 08/11/2023  Medication Sig   [DISCONTINUED] fluticasone-salmeterol (ADVAIR DISKUS) 250-50 MCG/ACT AEPB Inhale 1 puff into the lungs in the morning and at bedtime.   acetaminophen (TYLENOL) 500 MG tablet Take 1 tablet (500 mg total) by mouth every 6 (six) hours as needed. (Patient not taking: Reported on 08/11/2023)   albuterol (VENTOLIN HFA) 108 (90 Base) MCG/ACT inhaler Inhale 2 puffs into the lungs every 6 (six) hours as needed for wheezing or shortness of breath. (Patient not taking: Reported on 08/11/2023)   fluticasone (FLONASE) 50 MCG/ACT nasal spray Place 1 spray into both nostrils daily.   fluticasone-salmeterol (ADVAIR DISKUS) 250-50 MCG/ACT AEPB Inhale 1 puff into the lungs in the morning and at bedtime.   levocetirizine (XYZAL) 5 MG tablet Take 1 tablet (5 mg total) by mouth every evening.   montelukast (SINGULAIR) 10 MG tablet Take 1 tablet (10 mg total) by mouth at bedtime.   pantoprazole (PROTONIX) 40 MG tablet Take 1 tablet (40 mg total) by mouth daily. (Patient not taking: Reported on 08/11/2023)   pantoprazole (PROTONIX) 40 MG tablet Take 1 tablet (40 mg total) by mouth 2 (two) times daily. Take 30 minutes before breakfast and dinner. (Patient not taking: Reported on 08/11/2023)   [DISCONTINUED] fluticasone (FLONASE) 50 MCG/ACT nasal spray Place 1 spray into both nostrils daily. (Patient not taking: Reported on 08/11/2023)   [DISCONTINUED] fluticasone (FLONASE) 50 MCG/ACT nasal spray Place 1 spray into both nostrils daily.    [DISCONTINUED] levocetirizine (XYZAL) 5 MG tablet Take 1 tablet (5 mg total) by mouth every evening. (Patient not taking: Reported on 08/11/2023)   [DISCONTINUED] montelukast (SINGULAIR) 10 MG tablet Take 1 tablet (10 mg total) by mouth at bedtime. (Patient not taking: Reported on 08/11/2023)   No facility-administered encounter medications on file as of 08/11/2023.    Past Medical History:  Diagnosis Date   Asthma    Tuberculosis     History reviewed. No pertinent surgical history.  Family History  Family history unknown: Yes    Social History   Socioeconomic History   Marital status: Married    Spouse name: Not on file   Number of children: Not on file   Years of education: Not on file   Highest education level: Not on file  Occupational History   Not on file  Tobacco Use   Smoking status: Never    Passive exposure: Never   Smokeless tobacco: Never  Vaping Use   Vaping status: Never Used  Substance and Sexual Activity   Alcohol use: Never   Drug use: Never   Sexual activity: Yes    Partners: Male    Birth control/protection: I.U.D.  Other Topics Concern   Not on file  Social History Narrative   Not on file   Social Determinants of Health   Financial Resource Strain: Low Risk  (08/11/2023)   Overall Financial Resource Strain (CARDIA)    Difficulty of Paying Living Expenses: Not hard at all  Food Insecurity: No Food Insecurity (08/11/2023)  Hunger Vital Sign    Worried About Running Out of Food in the Last Year: Never true    Ran Out of Food in the Last Year: Never true  Transportation Needs: No Transportation Needs (08/11/2023)   PRAPARE - Administrator, Civil Service (Medical): No    Lack of Transportation (Non-Medical): No  Physical Activity: Inactive (08/11/2023)   Exercise Vital Sign    Days of Exercise per Week: 0 days    Minutes of Exercise per Session: 0 min  Stress: No Stress Concern Present (08/11/2023)   [employer] of  Occupational Health - Occupational Stress Questionnaire    Feeling of Stress : Not at all  Social Connections: Not on file  Intimate Partner Violence: Not At Risk (08/11/2023)   Humiliation, Afraid, Rape, and Kick questionnaire    Fear of Current or Ex-Partner: No    Emotionally Abused: No    Physically Abused: No    Sexually Abused: No    Review of Systems  All other systems reviewed and are negative.       Objective    BP 104/72 (BP Location: Left Arm, Patient Position: Sitting, Cuff Size: Normal)   Pulse 65   Temp 98.5 F (36.9 C) (Oral)   Resp 16   Ht 5\' 5"  (1.651 m)   Wt 150 lb (68 kg)   SpO2 98%   BMI 24.96 kg/m   Physical Exam Vitals and nursing note reviewed.  Constitutional:      General: She is not in acute distress. Cardiovascular:     Rate and Rhythm: Normal rate and regular rhythm.  Pulmonary:     Effort: Pulmonary effort is normal.     Breath sounds: Normal breath sounds.  Abdominal:     Palpations: Abdomen is soft.     Tenderness: There is no abdominal tenderness.  Neurological:     General: No focal deficit present.     Mental Status: She is alert and oriented to person, place, and time.         Assessment & Plan:   Mild intermittent asthma without complication -     Montelukast Sodium; Take 1 tablet (10 mg total) by mouth at bedtime.  Dispense: 90 tablet; Refill: 3 -     Levocetirizine Dihydrochloride; Take 1 tablet (5 mg total) by mouth every evening.  Dispense: 90 tablet; Refill: 1  Acute maxillary sinusitis, recurrence not specified -     Fluticasone Propionate; Place 1 spray into both nostrils daily.  Dispense: 16 g; Refill: 2  Encounter for other general counseling or advice on contraception -     Ambulatory referral to Gynecology  Language barrier to communication  Other orders -     Fluticasone-Salmeterol; Inhale 1 puff into the lungs in the morning and at bedtime.  Dispense: 60 each; Refill: 5     Return for prn.    Tommie Raymond, MD

## 2023-08-24 ENCOUNTER — Ambulatory Visit: Payer: Medicaid Other

## 2023-08-24 DIAGNOSIS — Z23 Encounter for immunization: Secondary | ICD-10-CM

## 2023-08-30 ENCOUNTER — Encounter: Payer: Self-pay | Admitting: Internal Medicine

## 2023-08-30 ENCOUNTER — Ambulatory Visit: Payer: Medicaid Other | Admitting: Internal Medicine

## 2023-08-30 VITALS — BP 104/65 | HR 66 | Temp 98.4°F | Resp 18 | Ht 65.0 in | Wt 150.0 lb

## 2023-08-30 DIAGNOSIS — K219 Gastro-esophageal reflux disease without esophagitis: Secondary | ICD-10-CM

## 2023-08-30 MED ORDER — SODIUM CHLORIDE 0.9 % IV SOLN: 500.0000 mL | Freq: Once | INTRAVENOUS | Status: DC

## 2023-08-30 NOTE — Progress Notes (Signed)
Called to room to assist during endoscopic procedure.  Patient ID and intended procedure confirmed with present staff. Received instructions for my participation in the procedure from the performing physician.  

## 2023-08-30 NOTE — Progress Notes (Signed)
Pt's states no medical or surgical changes since previsit or office visit. 

## 2023-08-30 NOTE — Op Note (Signed)
[facility] Patient Name: Marcia Miller Procedure Date: 08/30/2023 10:13 AM MRN: [id number] Endoscopist: Particia Lather , , [id number] Age: 47 Referring MD:  Date of Birth: August 19, 1976 Gender: Female Account #: [id number] Procedure:                Upper GI endoscopy Indications:              Heartburn Medicines:                Monitored Anesthesia Care Procedure:                Pre-Anesthesia Assessment:                           - Prior to the procedure, a History and Physical                            was performed, and patient medications and                            allergies were reviewed. The patient's tolerance of                            previous anesthesia was also reviewed. The risks                            and benefits of the procedure and the sedation                            options and risks were discussed with the patient.                            All questions were answered, and informed consent                            was obtained. Prior Anticoagulants: The patient has                            taken no anticoagulant or antiplatelet agents. ASA                            Grade Assessment: II - A patient with mild systemic                            disease. After reviewing the risks and benefits,                            the patient was deemed in satisfactory condition to                            undergo the procedure.                           After obtaining informed consent, the endoscope was  passed under direct vision. Throughout the                            procedure, the patient's blood pressure, pulse, and                            oxygen saturations were monitored continuously. The                            GIF [id number] #[id number] was introduced through the                            mouth, and advanced to the second part of duodenum.                            The upper GI endoscopy was  accomplished without                            difficulty. The patient tolerated the procedure                            well. Scope In: Scope Out: Findings:                 The examined esophagus was normal. Biopsies were                            taken with a cold forceps for histology.                           Localized erythematous mucosa without bleeding was                            found in the gastric antrum. Biopsies were taken                            with a cold forceps for histology.                           The examined duodenum was normal. Complications:            No immediate complications. Estimated Blood Loss:     Estimated blood loss was minimal. Impression:               - Normal esophagus. Biopsied.                           - Erythematous mucosa in the antrum. Biopsied.                           - Normal examined duodenum. Recommendation:           - Discharge patient to home (with escort).                           - Await pathology results.                           -  Return to GI clinic in 2-3 months.                           - The findings and recommendations were discussed                            with the patient. Dr Particia Lather "[place]" [place],  08/30/2023 10:33:23 AM

## 2023-08-30 NOTE — Patient Instructions (Addendum)
Be sure to take your protonix on an empty stomaInterpreter [facility].  Interpreter's name is-Bedour..  Take 1/2  hour before eating.  Resume all of your previous medications as ordred.  Read the handouts given to you by your recovery room nurse.  YOU HAD AN ENDOSCOPIC PROCEDURE TODAY AT THE [facility]:   Refer to the procedure report that was given to you for any specific questions about what was found during the examination.  If the procedure report does not answer your questions, please call your gastroenterologist to clarify.  If you requested that your care partner not be given the details of your procedure findings, then the procedure report has been included in a sealed envelope for you to review at your convenience later.  YOU SHOULD EXPECT: Some feelings of bloating in the abdomen. Passage of more gas than usual.   Please Note:  You might notice some irritation and congestion in your nose or some drainage.  This is from the oxygen used during your procedure.  There is no need for concern and it should clear up in a day or so.  SYMPTOMS TO REPORT IMMEDIATELY:  Following upper endoscopy (EGD)  Vomiting of blood or coffee ground material  New chest pain or pain under the shoulder blades  Painful or persistently difficult swallowing  New shortness of breath  Fever of 100F or higher  Black, tarry-looking stools  For urgent or emergent issues, a gastroenterologist can be reached at any hour by calling [phone]. Do not use MyChart messaging for urgent concerns.    DIET:  We do recommend a small meal at first, but then you may proceed to your regular diet.  Drink plenty of fluids but you should avoid alcoholic beverages for 24 hours.  ACTIVITY:  You should plan to take it easy for the rest of today and you should NOT DRIVE or use heavy machinery until tomorrow (because of the sedation medicines used during the test).     FOLLOW UP: Our staff will call the number listed on your records the next business day following your procedure.  We will call around 7:15- 8:00 am to check on you and address any questions or concerns that you may have regarding the information given to you following your procedure. If we do not reach you, we will leave a message.     If any biopsies were taken you will be contacted by phone or by letter within the next 1-3 weeks.  Please call us at [phone] if you have not heard about the biopsies in 3 weeks.    SIGNATURES/CONFIDENTIALITY: You and/or your care partner have signed paperwork which will be entered into your electronic medical record.  These signatures attest to the fact that that the information above on your After Visit Summary has been reviewed and is understood.  Full responsibility of the confidentiality of this discharge information lies with you and/or your care-partner.Interpreter used today at the [facility] for this pt.  Interpreter's name is-

## 2023-08-30 NOTE — Progress Notes (Signed)
Report to PACU, RN, vss, BBS= Clear.  

## 2023-08-30 NOTE — Progress Notes (Signed)
GASTROENTEROLOGY PROCEDURE H&P NOTE   Primary Care Physician: Georganna Skeans, MD    Reason for Procedure:   GERD  Plan:    EGD  Patient is appropriate for endoscopic procedure(s) in the ambulatory ([facility]) setting.  The nature of the procedure, as well as the risks, benefits, and alternatives were carefully and thoroughly reviewed with the patient. Ample time for discussion and questions allowed. The patient understood, was satisfied, and agreed to proceed.     HPI: Marcia Miller is a 47 y.o. female who presents for EGD for evaluation of GERD .  Patient was most recently seen in the Gastroenterology Clinic on 08/10/23.  No interval change in medical history since that appointment. Please refer to that note for full details regarding GI history and clinical presentation.   Past Medical History:  Diagnosis Date   Asthma    Tuberculosis     History reviewed. No pertinent surgical history.  Prior to Admission medications   Medication Sig Start Date End Date Taking? Authorizing Provider  acetaminophen (TYLENOL) 500 MG tablet Take 1 tablet (500 mg total) by mouth every 6 (six) hours as needed. 05/11/23  Yes Zonia Kief, Amy J, NP  albuterol (VENTOLIN HFA) 108 (90 Base) MCG/ACT inhaler Inhale 2 puffs into the lungs every 6 (six) hours as needed for wheezing or shortness of breath. 06/10/23  Yes Glenford Bayley, NP  fluticasone (FLONASE) 50 MCG/ACT nasal spray Place 1 spray into both nostrils daily. 08/11/23  Yes Georganna Skeans, MD  fluticasone-salmeterol (ADVAIR DISKUS) 250-50 MCG/ACT AEPB Inhale 1 puff into the lungs in the morning and at bedtime. 08/11/23  Yes Georganna Skeans, MD  levocetirizine (XYZAL) 5 MG tablet Take 1 tablet (5 mg total) by mouth every evening. 08/11/23  Yes Georganna Skeans, MD  montelukast (SINGULAIR) 10 MG tablet Take 1 tablet (10 mg total) by mouth at bedtime. 08/11/23  Yes Georganna Skeans, MD  pantoprazole (PROTONIX) 40 MG tablet Take 1 tablet (40 mg  total) by mouth 2 (two) times daily. Take 30 minutes before breakfast and dinner. 08/10/23  Yes Arnaldo Natal, NP    Current Outpatient Medications  Medication Sig Dispense Refill   acetaminophen (TYLENOL) 500 MG tablet Take 1 tablet (500 mg total) by mouth every 6 (six) hours as needed. 30 tablet 1   albuterol (VENTOLIN HFA) 108 (90 Base) MCG/ACT inhaler Inhale 2 puffs into the lungs every 6 (six) hours as needed for wheezing or shortness of breath. 8 g 1   fluticasone (FLONASE) 50 MCG/ACT nasal spray Place 1 spray into both nostrils daily. 16 g 2   fluticasone-salmeterol (ADVAIR DISKUS) 250-50 MCG/ACT AEPB Inhale 1 puff into the lungs in the morning and at bedtime. 60 each 5   levocetirizine (XYZAL) 5 MG tablet Take 1 tablet (5 mg total) by mouth every evening. 90 tablet 1   montelukast (SINGULAIR) 10 MG tablet Take 1 tablet (10 mg total) by mouth at bedtime. 90 tablet 3   pantoprazole (PROTONIX) 40 MG tablet Take 1 tablet (40 mg total) by mouth 2 (two) times daily. Take 30 minutes before breakfast and dinner. 60 tablet 1   Current Facility-Administered Medications  Medication Dose Route Frequency Provider Last Rate Last Admin   0.9 %  sodium chloride infusion  500 mL Intravenous Once Imogene Burn, MD        Allergies as of 08/30/2023   (No Known Allergies)    Family History  Family history unknown: Yes    Social History  Socioeconomic History   Marital status: Married    Spouse name: Not on file   Number of children: Not on file   Years of education: Not on file   Highest education level: Not on file  Occupational History   Not on file  Tobacco Use   Smoking status: Never    Passive exposure: Never   Smokeless tobacco: Never  Vaping Use   Vaping status: Never Used  Substance and Sexual Activity   Alcohol use: Never   Drug use: Never   Sexual activity: Yes    Partners: Male    Birth control/protection: I.U.D.  Other Topics Concern   Not on file  Social  History Narrative   Not on file   Social Determinants of Health   Financial Resource Strain: Low Risk  (08/11/2023)   Overall Financial Resource Strain (CARDIA)    Difficulty of Paying Living Expenses: Not hard at all  Food Insecurity: No Food Insecurity (08/11/2023)   Hunger Vital Sign    Worried About Running Out of Food in the Last Year: Never true    Ran Out of Food in the Last Year: Never true  Transportation Needs: No Transportation Needs (08/11/2023)   PRAPARE - Administrator, Civil Service (Medical): No    Lack of Transportation (Non-Medical): No  Physical Activity: Inactive (08/11/2023)   Exercise Vital Sign    Days of Exercise per Week: 0 days    Minutes of Exercise per Session: 0 min  Stress: No Stress Concern Present (08/11/2023)   [employer] of Occupational Health - Occupational Stress Questionnaire    Feeling of Stress : Not at all  Social Connections: Not on file  Intimate Partner Violence: Not At Risk (08/11/2023)   Humiliation, Afraid, Rape, and Kick questionnaire    Fear of Current or Ex-Partner: No    Emotionally Abused: No    Physically Abused: No    Sexually Abused: No    Physical Exam: Vital signs in last 24 hours: BP 95/60   Pulse 68   Temp 98.4 F (36.9 C) (Temporal)   Ht 5\' 5"  (1.651 m)   Wt 150 lb (68 kg)   SpO2 100%   BMI 24.96 kg/m  GEN: NAD EYE: Sclerae anicteric ENT: MMM CV: Non-tachycardic Pulm: No increased WOB GI: Soft NEURO:  Alert & Oriented   Eulah Pont, MD [facility]   08/30/2023 10:11 AM

## 2023-08-31 ENCOUNTER — Telehealth: Payer: Self-pay

## 2023-08-31 NOTE — Telephone Encounter (Signed)
  Follow up Call-     08/30/2023    9:42 AM  Call back number  Post procedure Call Back phone  # [phone]-needs interpreter  Permission to leave phone message Yes     Patient questions:  Do you have a fever, pain , or abdominal swelling? No. Pain Score  0 *  Have you tolerated food without any problems? Yes.    Have you been able to return to your normal activities? Yes.    Do you have any questions about your discharge instructions: Diet   No. Medications  No. Follow up visit  No.  Do you have questions or concerns about your Care? No.  Actions: * If pain score is 4 or above: No action needed, pain <4.

## 2023-09-01 ENCOUNTER — Encounter: Payer: Self-pay | Admitting: Internal Medicine

## 2023-09-01 LAB — SURGICAL PATHOLOGY

## 2023-09-02 NOTE — Telephone Encounter (Signed)
Inbound call from patient along with interpreter, States she was doing well the day after the procedure but yesterday she started experiencing headache and abdominal pains, she would like to discuss those symptoms with a nurse.   [phone] is the patient's personal cell number.

## 2023-09-05 NOTE — Telephone Encounter (Signed)
Left message on machine to call back using interpreter

## 2023-09-19 ENCOUNTER — Ambulatory Visit: Payer: Medicaid Other | Admitting: Pulmonary Disease

## 2023-09-19 ENCOUNTER — Encounter: Payer: Self-pay | Admitting: Pulmonary Disease

## 2023-09-19 VITALS — BP 128/70 | HR 78 | Ht 65.0 in | Wt 151.0 lb

## 2023-09-19 DIAGNOSIS — J4530 Mild persistent asthma, uncomplicated: Secondary | ICD-10-CM

## 2023-09-19 DIAGNOSIS — K219 Gastro-esophageal reflux disease without esophagitis: Secondary | ICD-10-CM | POA: Diagnosis not present

## 2023-09-19 DIAGNOSIS — J4520 Mild intermittent asthma, uncomplicated: Secondary | ICD-10-CM | POA: Diagnosis not present

## 2023-09-19 DIAGNOSIS — R053 Chronic cough: Secondary | ICD-10-CM | POA: Diagnosis not present

## 2023-09-19 DIAGNOSIS — J0100 Acute maxillary sinusitis, unspecified: Secondary | ICD-10-CM

## 2023-09-19 MED ORDER — AMOXICILLIN-POT CLAVULANATE 875-125 MG PO TABS: 1.0000 | ORAL_TABLET | Freq: Two times a day (BID) | ORAL | 0 refills | Status: DC

## 2023-09-19 NOTE — Progress Notes (Signed)
Full PFT performed today. °

## 2023-09-19 NOTE — Progress Notes (Unsigned)
Synopsis: Referred in February 2024 for chronic cough by Georganna Skeans, MD  Subjective:   PATIENT ID: Marcia Miller GENDER: female DOB: 1976/04/18, MRN: [id number]  HPI  No chief complaint on file.  [name] is a 47 year old woman, never smoker with history of tuberculosis and asthma who returns to pulmonary clinic for chronic cough.   Initial OV 12/27/22 Arabic translator used throughout visit.  Patient reports intermittent cough over the past 7 years. The cough is productive with yellow mucous. She has chest tightness and shortness of breath with the cough. She has intermittent wheezing as well. She has symbicort inhaler which was prescribed in [place] which has helped her symptoms.They have recently moved to the [place] from [place]. Strong perfumes/colognes, hand sanitizer and certain cleaning agents can bother her breathing.   She has significant reflux symptoms and has been on PPI therapy in the past. She is concerned that the steroid in symbicort makes her reflux worse. She has stomach discomfort, nausea and vomiting intermittently with eating. Denies diarrhea. No issues with weight loss.   She is a never smoker and denies second hand smoke exposure. She lives with her husband and children.   OV 05/25/23 Her symptoms increased 10 days ago. She feels every 6 months her symptoms are recurring.  She is not currently taking any medications as discussed from last visit.  They report issues picking up medications prescribed at last visit.   She complains of frequent headache and would like neurology visit.   She has not been seen by GI for her reflux disease due to issues in scheduling an appointment.  OV 06/13/23 She returns today for acute visit due to increased cough, postnasal drainage and sinus pressure/pain and headache.  She was started on Wixela inhaler 1 puff twice daily, fluticasone nasal spray and pantoprazole 40 mg daily at last visit and reports she does not  have much improvement in her cough symptoms.  Today 09/19/23    Past Medical History:  Diagnosis Date   Asthma    Tuberculosis      Family History  Family history unknown: Yes     Social History   Socioeconomic History   Marital status: Married    Spouse name: Not on file   Number of children: Not on file   Years of education: Not on file   Highest education level: Not on file  Occupational History   Not on file  Tobacco Use   Smoking status: Never    Passive exposure: Never   Smokeless tobacco: Never  Vaping Use   Vaping status: Never Used  Substance and Sexual Activity   Alcohol use: Never   Drug use: Never   Sexual activity: Yes    Partners: Male    Birth control/protection: I.U.D.  Other Topics Concern   Not on file  Social History Narrative   Not on file   Social Determinants of Health   Financial Resource Strain: Low Risk  (08/11/2023)   Overall Financial Resource Strain (CARDIA)    Difficulty of Paying Living Expenses: Not hard at all  Food Insecurity: No Food Insecurity (08/11/2023)   Hunger Vital Sign    Worried About Running Out of Food in the Last Year: Never true    Ran Out of Food in the Last Year: Never true  Transportation Needs: No Transportation Needs (08/11/2023)   PRAPARE - Administrator, Civil Service (Medical): No    Lack of Transportation (Non-Medical): No  Physical Activity: Inactive (08/11/2023)   Exercise Vital Sign    Days of Exercise per Week: 0 days    Minutes of Exercise per Session: 0 min  Stress: No Stress Concern Present (08/11/2023)   [employer] of Occupational Health - Occupational Stress Questionnaire    Feeling of Stress : Not at all  Social Connections: Not on file  Intimate Partner Violence: Not At Risk (08/11/2023)   Humiliation, Afraid, Rape, and Kick questionnaire    Fear of Current or Ex-Partner: No    Emotionally Abused: No    Physically Abused: No    Sexually Abused: No     No Known  Allergies   Outpatient Medications Prior to Visit  Medication Sig Dispense Refill   acetaminophen (TYLENOL) 500 MG tablet Take 1 tablet (500 mg total) by mouth every 6 (six) hours as needed. 30 tablet 1   albuterol (VENTOLIN HFA) 108 (90 Base) MCG/ACT inhaler Inhale 2 puffs into the lungs every 6 (six) hours as needed for wheezing or shortness of breath. 8 g 1   fluticasone (FLONASE) 50 MCG/ACT nasal spray Place 1 spray into both nostrils daily. 16 g 2   fluticasone-salmeterol (ADVAIR DISKUS) 250-50 MCG/ACT AEPB Inhale 1 puff into the lungs in the morning and at bedtime. 60 each 5   levocetirizine (XYZAL) 5 MG tablet Take 1 tablet (5 mg total) by mouth every evening. 90 tablet 1   montelukast (SINGULAIR) 10 MG tablet Take 1 tablet (10 mg total) by mouth at bedtime. 90 tablet 3   pantoprazole (PROTONIX) 40 MG tablet Take 1 tablet (40 mg total) by mouth 2 (two) times daily. Take 30 minutes before breakfast and dinner. 60 tablet 1   No facility-administered medications prior to visit.    Review of Systems  Constitutional:  Negative for chills, fever, malaise/fatigue and weight loss.  HENT:  Positive for congestion and sinus pain. Negative for sore throat.   Eyes: Negative.   Respiratory:  Positive for cough, sputum production, shortness of breath and wheezing. Negative for hemoptysis.   Cardiovascular:  Negative for chest pain, palpitations, orthopnea, claudication and leg swelling.  Gastrointestinal:  Positive for heartburn and nausea. Negative for abdominal pain and vomiting.  Genitourinary: Negative.   Musculoskeletal:  Negative for joint pain and myalgias.  Skin:  Negative for rash.  Neurological:  Positive for headaches. Negative for weakness.  Endo/Heme/Allergies: Negative.   Psychiatric/Behavioral: Negative.      Objective:   There were no vitals filed for this visit.   Physical Exam Constitutional:      General: She is not in acute distress.    Appearance: She is not  ill-appearing.     Comments: Coughing throughout exam  HENT:     Head: Normocephalic and atraumatic.     Nose: Mucosal edema and congestion present.     Left Turbinates: Swollen.     Left Sinus: Maxillary sinus tenderness and frontal sinus tenderness present.  Eyes:     General: No scleral icterus.    Conjunctiva/sclera: Conjunctivae normal.  Cardiovascular:     Rate and Rhythm: Normal rate and regular rhythm.     Pulses: Normal pulses.     Heart sounds: Normal heart sounds. No murmur heard. Pulmonary:     Effort: Pulmonary effort is normal.     Breath sounds: Normal breath sounds. No wheezing, rhonchi or rales.  Musculoskeletal:     Right lower leg: No edema.     Left lower leg: No edema.  Skin:  General: Skin is warm and dry.  Neurological:     General: No focal deficit present.     Mental Status: She is alert.    CBC    Component Value Date/Time   WBC 5.2 08/10/2023 0950   RBC 4.47 08/10/2023 0950   HGB 13.0 08/10/2023 0950   HGB 13.0 06/30/2022 0950   HCT 40.2 08/10/2023 0950   HCT 40.2 06/30/2022 0950   PLT 219.0 08/10/2023 0950   PLT 211 06/30/2022 0950   MCV 89.8 08/10/2023 0950   MCV 92 06/30/2022 0950   MCH 29.6 06/30/2022 0950   MCH 29.9 01/22/2022 1230   MCHC 32.5 08/10/2023 0950   RDW 13.4 08/10/2023 0950   RDW 12.0 06/30/2022 0950   LYMPHSABS 1.7 08/10/2023 0950   LYMPHSABS 1.9 06/30/2022 0950   MONOABS 0.4 08/10/2023 0950   EOSABS 0.1 08/10/2023 0950   EOSABS 0.1 06/30/2022 0950   BASOSABS 0.0 08/10/2023 0950   BASOSABS 0.0 06/30/2022 0950      Latest Ref Rng & Units 08/10/2023    9:50 AM 06/30/2022    9:50 AM 01/22/2022   12:30 PM  BMP  Glucose 70 - 99 mg/dL 91  93  161   BUN 6 - 23 mg/dL 8  8  <5   Creatinine 0.96 - 1.20 mg/dL 0.45  4.09  8.11   BUN/Creat Ratio 9 - 23  13    Sodium 135 - 145 mEq/L 138  137  138   Potassium 3.5 - 5.1 mEq/L 4.0  4.6  3.8   Chloride 96 - 112 mEq/L 106  103  107   CO2 19 - 32 mEq/L 24  22  23    Calcium 8.4  - 10.5 mg/dL 9.3  9.6  91.4    Chest imaging: CXR 03/24/22 The heart size and mediastinal contours are within normal limits. Both lungs are clear. The visualized skeletal structures are unremarkable.  PFT:     No data to display          Labs:  Path:  Echo:  Heart Catheterization:  Assessment & Plan:   No diagnosis found.  Discussion: Lania Arts is a 47 year old woman, never smoker with history of tuberculosis and asthma who returns to pulmonary clinic for chronic cough.   Her clinical history is concerning for asthma with triggers that include colognes/perfumes, hand sanitizer/cleaning agents and reflux disease.  She currently has acute sinusitis of the left frontal and maxillary sinuses based on exam.  She is to start Augmentin 1 tab twice daily for 10 days and prednisone 20 mg daily for 7 days.  She is to use wixella 100-49mcg 1 puff twice daily for asthma. She is to continue montelukast daily.   She is to take pantoprazole 40mg  daily, 30 minutes before breakfast for GERD.  She has follow-up with [facility] for further evaluation  Chest radiograph today is unremarkable without infiltrates, opacities or pleural effusions.  Follow-up as previously scheduled.  Melody Comas, MD [facility] Office: [phone]   Current Outpatient Medications:    acetaminophen (TYLENOL) 500 MG tablet, Take 1 tablet (500 mg total) by mouth every 6 (six) hours as needed., Disp: 30 tablet, Rfl: 1   albuterol (VENTOLIN HFA) 108 (90 Base) MCG/ACT inhaler, Inhale 2 puffs into the lungs every 6 (six) hours as needed for wheezing or shortness of breath., Disp: 8 g, Rfl: 1   fluticasone (FLONASE) 50 MCG/ACT nasal spray, Place 1 spray into both nostrils  daily., Disp: 16 g, Rfl: 2   fluticasone-salmeterol (ADVAIR DISKUS) 250-50 MCG/ACT AEPB, Inhale 1 puff into the lungs in the morning and at bedtime., Disp: 60 each, Rfl: 5   levocetirizine (XYZAL) 5 MG tablet,  Take 1 tablet (5 mg total) by mouth every evening., Disp: 90 tablet, Rfl: 1   montelukast (SINGULAIR) 10 MG tablet, Take 1 tablet (10 mg total) by mouth at bedtime., Disp: 90 tablet, Rfl: 3   pantoprazole (PROTONIX) 40 MG tablet, Take 1 tablet (40 mg total) by mouth 2 (two) times daily. Take 30 minutes before breakfast and dinner., Disp: 60 tablet, Rfl: 1

## 2023-09-19 NOTE — Patient Instructions (Signed)
Continue advair 250-13mcg 1 puff twice daily  Continue pantoprazole 40mg  daily twice daily  Start augmentin antbiotic 1 tab twice daily for 7 days for sinus infection  You breathing tests are normal   Follow up in 6 months

## 2023-09-19 NOTE — Patient Instructions (Signed)
Full PFT performed today. °

## 2023-09-20 ENCOUNTER — Encounter: Payer: Self-pay | Admitting: Pulmonary Disease

## 2023-09-29 IMAGING — CT CT RENAL STONE PROTOCOL
2 of 4 series · 16 of 46 positions shown, 18 images · non-contrast
Comparison: None.

CLINICAL DATA: Flank pain, nausea



[Series 3: stone study 5.0 i30f 2 · axial · 0.82mm/px · z∈[+745,+1135]mm · 13 of 86 slices shown, 15 images]
[im 4/86  soft-tissue]
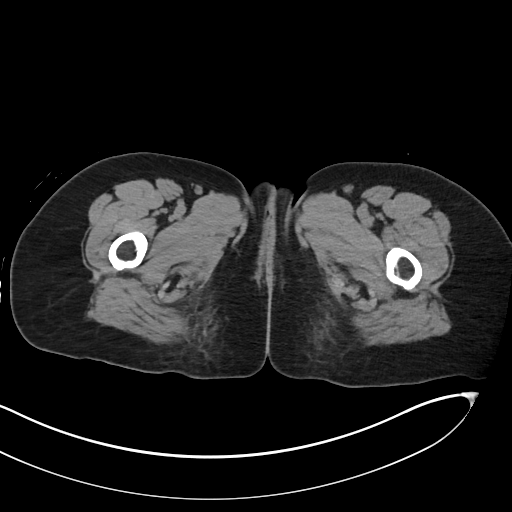
[im 4/86  bone]
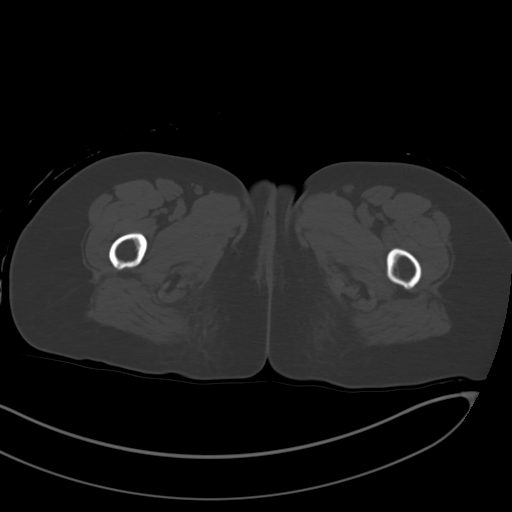
[im 10/86  soft-tissue]
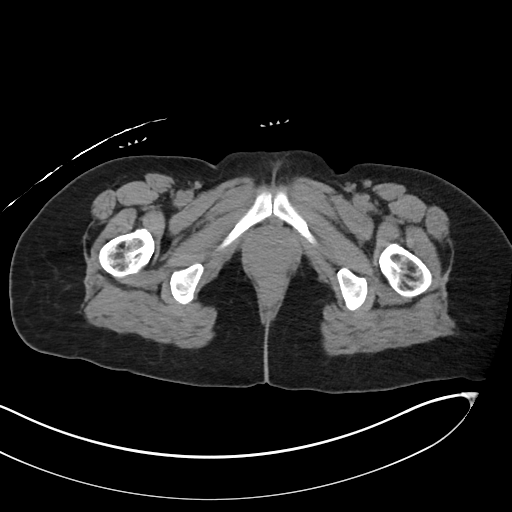
[im 17/86  soft-tissue]
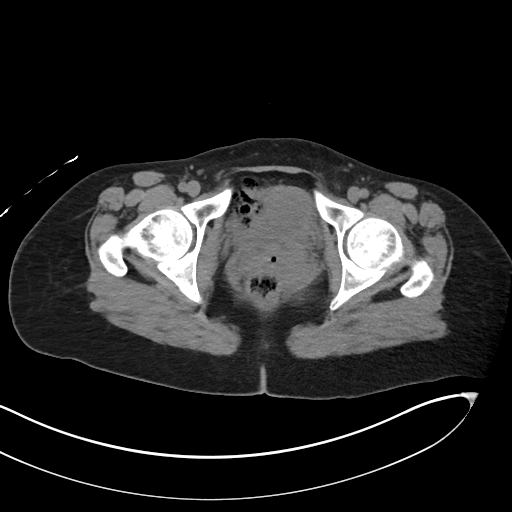
[im 23/86  soft-tissue]
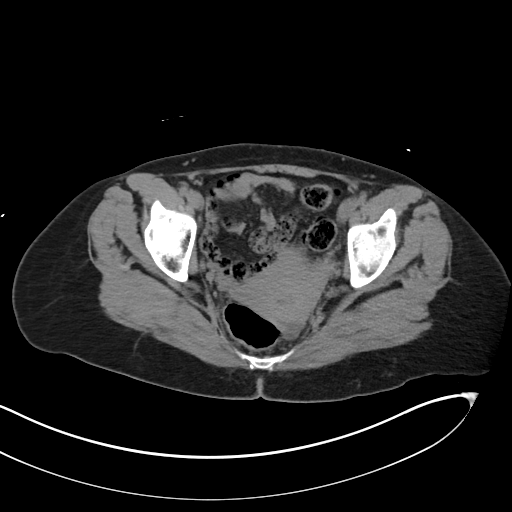
[im 30/86  soft-tissue]
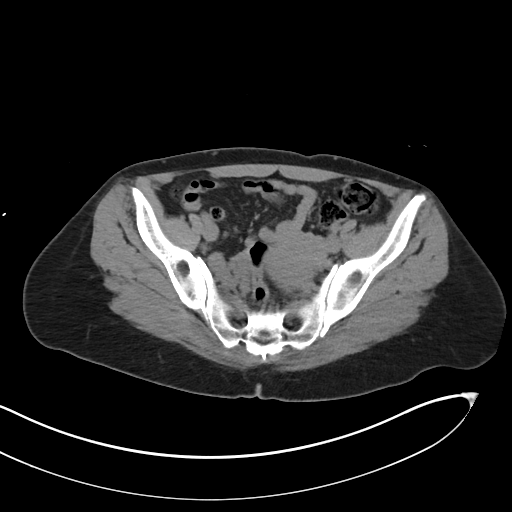
[im 36/86  soft-tissue]
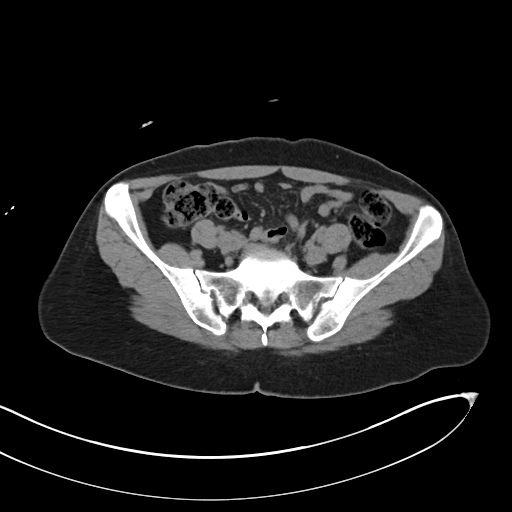
[im 43/86  soft-tissue]
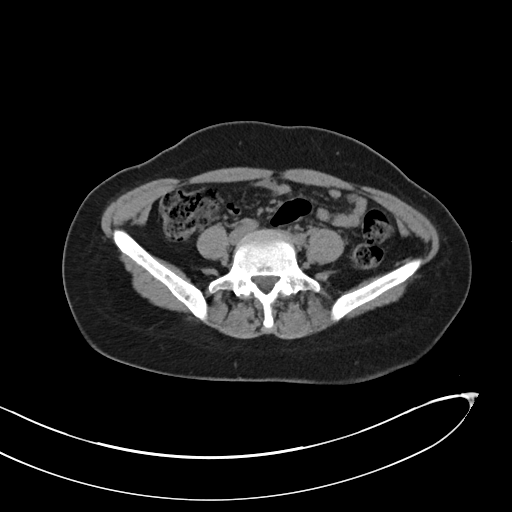
[im 50/86  soft-tissue]
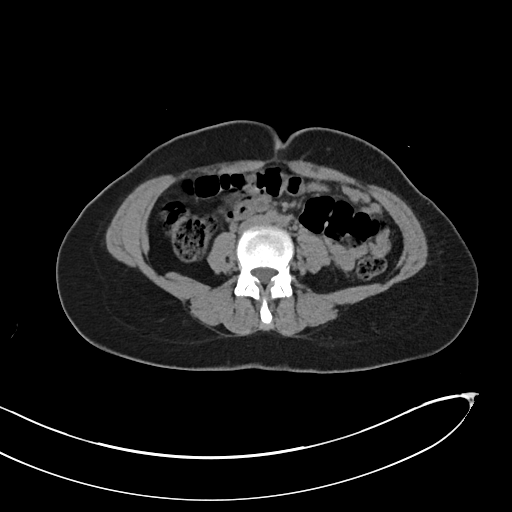
[im 56/86  soft-tissue]
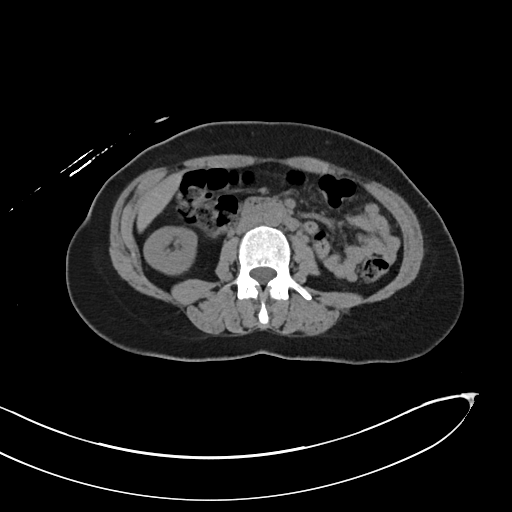
[im 56/86  bone]
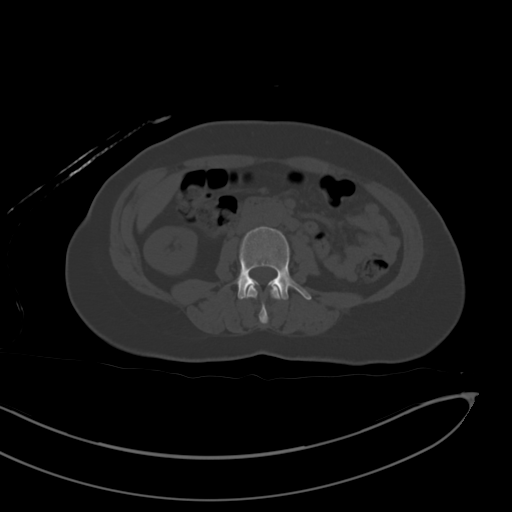
[im 63/86  soft-tissue]
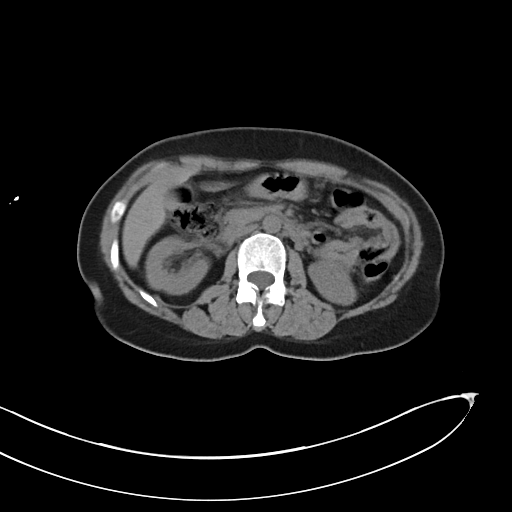
[im 69/86  soft-tissue]
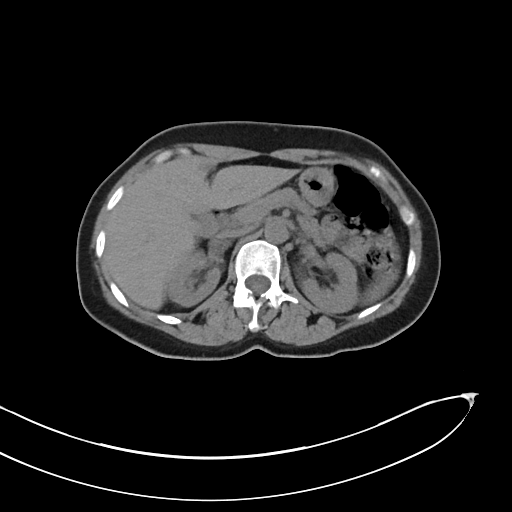
[im 76/86  soft-tissue]
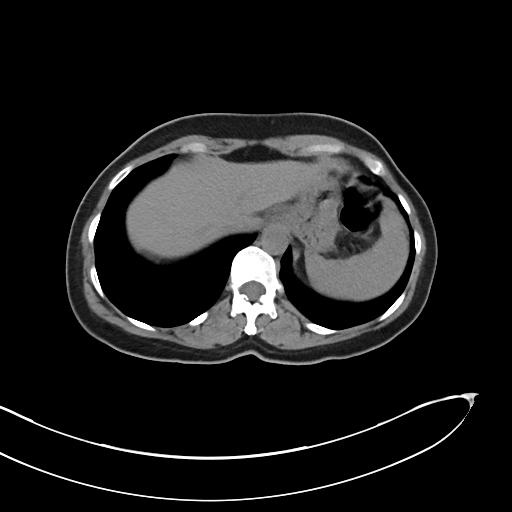
[im 82/86  soft-tissue]
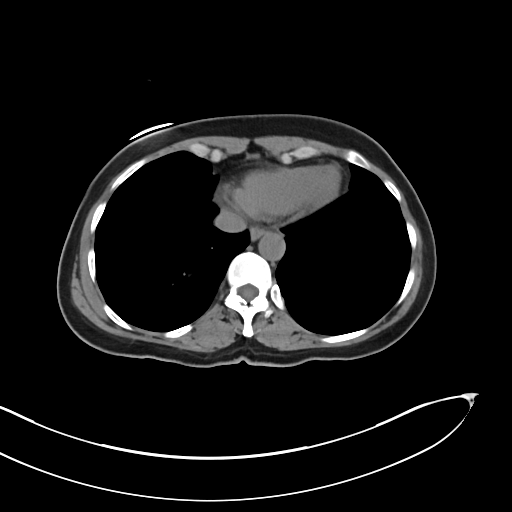

[Series 6: coronal soft tissue · coronal · 0.74mm/px · 3 of 86 slices shown]
[im 29/86  soft-tissue]
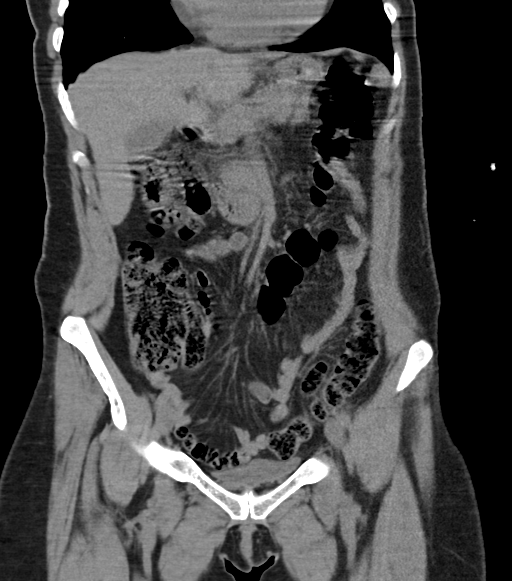
[im 38/86  soft-tissue]
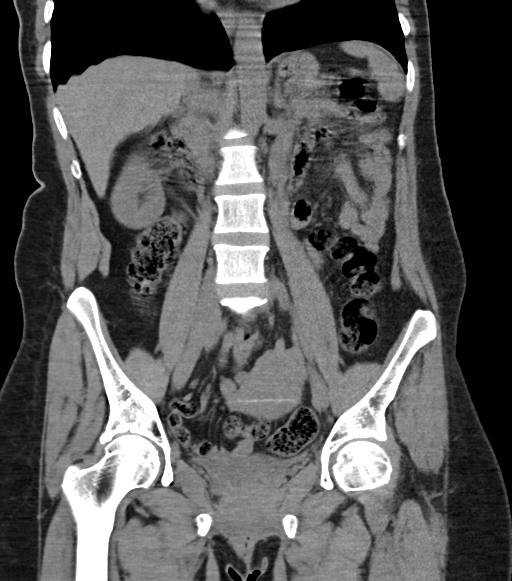
[im 48/86  soft-tissue]
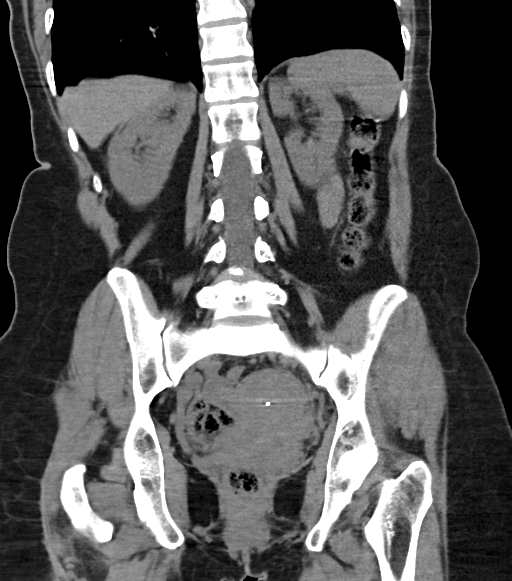

[16 of 46 positions shown; findings below may reference images not displayed]

FINDINGS: There is significant motion artifacts in the upper abdomen limiting
the study.

Lower chest: Unremarkable.

Hepatobiliary: No focal abnormality is seen. Evaluation of liver and
gallbladder is somewhat limited by motion artifacts. There is no
dilation of bile ducts.

Pancreas: No focal abnormality is seen.

Spleen: Unremarkable.

Adrenals/Urinary Tract: Adrenals are not enlarged. There is no
hydronephrosis. Evaluation of renal cortex is limited by motion
artifacts. No definite renal or ureteral stones are seen. Urinary
bladder is not distended.

Stomach/Bowel: Stomach is not distended. Small bowel loops are not
dilated. Appendix is not dilated. There is no significant wall
thickening in colon. There is no pericolic stranding.

Vascular/Lymphatic: Unremarkable.

Reproductive: IUD is seen in the uterus. There is trace amount of
free fluid in cul-de-sac.

Other: There is no ascites or pneumoperitoneum. Small umbilical
hernia containing fat is seen.

Musculoskeletal: Degenerative changes are noted with posterior bony
spurs at L4-L5 level.
IMPRESSION: Motion limited study.

There is no evidence of intestinal obstruction or pneumoperitoneum.
There is no hydronephrosis. Appendix is not dilated.

Trace amount of free fluid in the pelvis may suggest recent rupture
of ovarian cyst or follicle. IUD is seen in the uterus.

Other findings as described in the body of the report.

## 2023-10-12 ENCOUNTER — Encounter: Payer: Self-pay | Admitting: Family Medicine

## 2023-10-12 ENCOUNTER — Ambulatory Visit: Payer: Medicaid Other | Admitting: Family Medicine

## 2023-10-12 VITALS — BP 105/74 | HR 64 | Temp 98.1°F | Resp 16 | Wt 151.2 lb

## 2023-10-12 DIAGNOSIS — R519 Headache, unspecified: Secondary | ICD-10-CM

## 2023-10-12 DIAGNOSIS — R300 Dysuria: Secondary | ICD-10-CM | POA: Diagnosis not present

## 2023-10-12 DIAGNOSIS — Z308 Encounter for other contraceptive management: Secondary | ICD-10-CM

## 2023-10-12 DIAGNOSIS — Z23 Encounter for immunization: Secondary | ICD-10-CM | POA: Diagnosis not present

## 2023-10-12 DIAGNOSIS — Z789 Other specified health status: Secondary | ICD-10-CM

## 2023-10-12 LAB — POCT URINALYSIS DIP (CLINITEK)
Bilirubin, UA: NEGATIVE
Blood, UA: NEGATIVE
Glucose, UA: NEGATIVE mg/dL
Ketones, POC UA: NEGATIVE mg/dL
Leukocytes, UA: NEGATIVE
Nitrite, UA: NEGATIVE
POC PROTEIN,UA: NEGATIVE
Spec Grav, UA: >=1.03 — AB (ref 1.010–1.025)
Urobilinogen, UA: 0.2 U/dL
pH, UA: 6 (ref 5.0–8.0)

## 2023-10-12 NOTE — Progress Notes (Signed)
Established Patient Office Visit  Subjective    Patient ID: Marcia Miller, female    DOB: 04-Oct-1976  Age: 47 y.o. MRN: [id number]  CC:  Chief Complaint  Patient presents with   Follow-up    2 month   Headache    Chest tightness    HPI Marcia Miller presents with complaint of headaches and she reports that she has a family history of headaches that after evaluation required some surgical intervention. This visit was aided by an interpreter.   Outpatient Encounter Medications as of 10/12/2023  Medication Sig   acetaminophen (TYLENOL) 500 MG tablet Take 1 tablet (500 mg total) by mouth every 6 (six) hours as needed.   albuterol (VENTOLIN HFA) 108 (90 Base) MCG/ACT inhaler Inhale 2 puffs into the lungs every 6 (six) hours as needed for wheezing or shortness of breath.   amoxicillin-clavulanate (AUGMENTIN) 875-125 MG tablet Take 1 tablet by mouth 2 (two) times daily.   fluticasone (FLONASE) 50 MCG/ACT nasal spray Place 1 spray into both nostrils daily.   fluticasone-salmeterol (ADVAIR DISKUS) 250-50 MCG/ACT AEPB Inhale 1 puff into the lungs in the morning and at bedtime.   levocetirizine (XYZAL) 5 MG tablet Take 1 tablet (5 mg total) by mouth every evening.   montelukast (SINGULAIR) 10 MG tablet Take 1 tablet (10 mg total) by mouth at bedtime.   pantoprazole (PROTONIX) 40 MG tablet Take 1 tablet (40 mg total) by mouth 2 (two) times daily. Take 30 minutes before breakfast and dinner.   No facility-administered encounter medications on file as of 10/12/2023.    Past Medical History:  Diagnosis Date   Asthma    Tuberculosis     History reviewed. No pertinent surgical history.  Family History  Family history unknown: Yes    Social History   Socioeconomic History   Marital status: Married    Spouse name: Not on file   Number of children: Not on file   Years of education: Not on file   Highest education level: Not on file  Occupational History   Not  on file  Tobacco Use   Smoking status: Never    Passive exposure: Never   Smokeless tobacco: Never  Vaping Use   Vaping status: Never Used  Substance and Sexual Activity   Alcohol use: Never   Drug use: Never   Sexual activity: Yes    Partners: Male    Birth control/protection: I.U.D.  Other Topics Concern   Not on file  Social History Narrative   Not on file   Social Drivers of Health   Financial Resource Strain: Low Risk  (08/11/2023)   Overall Financial Resource Strain (CARDIA)    Difficulty of Paying Living Expenses: Not hard at all  Food Insecurity: No Food Insecurity (08/11/2023)   Hunger Vital Sign    Worried About Running Out of Food in the Last Year: Never true    Ran Out of Food in the Last Year: Never true  Transportation Needs: No Transportation Needs (08/11/2023)   PRAPARE - Administrator, Civil Service (Medical): No    Lack of Transportation (Non-Medical): No  Physical Activity: Inactive (08/11/2023)   Exercise Vital Sign    Days of Exercise per Week: 0 days    Minutes of Exercise per Session: 0 min  Stress: No Stress Concern Present (08/11/2023)   [employer] of Occupational Health - Occupational Stress Questionnaire    Feeling of Stress : Not at all  Social Connections:  Not on file  Intimate Partner Violence: Not At Risk (08/11/2023)   Humiliation, Afraid, Rape, and Kick questionnaire    Fear of Current or Ex-Partner: No    Emotionally Abused: No    Physically Abused: No    Sexually Abused: No    Review of Systems  Neurological:  Positive for headaches.  All other systems reviewed and are negative.       Objective    BP 105/74 (BP Location: Right Arm, Patient Position: Sitting, Cuff Size: Normal)   Pulse 64   Temp 98.1 F (36.7 C) (Oral)   Resp 16   Wt 151 lb 3.2 oz (68.6 kg)   SpO2 98%   BMI 25.16 kg/m   Physical Exam Vitals and nursing note reviewed.  Constitutional:      General: She is not in acute  distress. Cardiovascular:     Rate and Rhythm: Normal rate and regular rhythm.  Pulmonary:     Effort: Pulmonary effort is normal.     Breath sounds: Normal breath sounds.  Abdominal:     Palpations: Abdomen is soft.     Tenderness: There is no abdominal tenderness.  Neurological:     General: No focal deficit present.     Mental Status: She is alert and oriented to person, place, and time.         Assessment & Plan:   Nonintractable headache, unspecified chronicity pattern, unspecified headache type -     MR BRAIN WO CONTRAST; Future  Encounter for other contraceptive management -     Ambulatory referral to Obstetrics / Gynecology  Dysuria -     POCT URINALYSIS DIP (CLINITEK)  Encounter for immunization  Language barrier to communication     No follow-ups on file.   Tommie Raymond, MD

## 2023-10-14 ENCOUNTER — Encounter: Payer: Self-pay | Admitting: Family Medicine

## 2023-10-24 LAB — PULMONARY FUNCTION TEST
DL/VA % pred: 108 %
DL/VA: 4.7 ml/min/mmHg/L
DLCO cor % pred: 85 %
DLCO cor: 19.07 ml/min/mmHg
DLCO unc % pred: 85 %
DLCO unc: 19.07 ml/min/mmHg
FEF 25-75 Post: 3.65 L/s
FEF 25-75 Pre: 3.04 L/s
FEF2575-%Change-Post: 19 %
FEF2575-%Pred-Post: 121 %
FEF2575-%Pred-Pre: 101 %
FEV1-%Change-Post: 3 %
FEV1-%Pred-Post: 92 %
FEV1-%Pred-Pre: 89 %
FEV1-Post: 2.77 L
FEV1-Pre: 2.68 L
FEV1FVC-%Change-Post: 4 %
FEV1FVC-%Pred-Pre: 101 %
FEV6-%Change-Post: -1 %
FEV6-%Pred-Post: 87 %
FEV6-%Pred-Pre: 88 %
FEV6-Post: 3.2 L
FEV6-Pre: 3.24 L
FEV6FVC-%Pred-Post: 102 %
FEV6FVC-%Pred-Pre: 102 %
FVC-%Change-Post: -1 %
FVC-%Pred-Post: 85 %
FVC-%Pred-Pre: 86 %
FVC-Post: 3.2 L
FVC-Pre: 3.24 L
Post FEV1/FVC ratio: 86 %
Post FEV6/FVC ratio: 100 %
Pre FEV1/FVC ratio: 83 %
Pre FEV6/FVC Ratio: 100 %
RV % pred: 89 %
RV: 1.56 L
TLC % pred: 89 %
TLC: 4.67 L

## 2023-11-30 ENCOUNTER — Other Ambulatory Visit: Payer: Self-pay | Admitting: Nurse Practitioner

## 2023-11-30 DIAGNOSIS — Z1231 Encounter for screening mammogram for malignant neoplasm of breast: Secondary | ICD-10-CM

## 2023-12-07 ENCOUNTER — Ambulatory Visit
Admission: RE | Admit: 2023-12-07 | Discharge: 2023-12-07 | Disposition: A | Discharge status: Home / Self Care | Payer: Medicaid Other | Source: Ambulatory Visit | Attending: Nurse Practitioner | Admitting: Nurse Practitioner

## 2023-12-07 DIAGNOSIS — Z1231 Encounter for screening mammogram for malignant neoplasm of breast: Secondary | ICD-10-CM

## 2023-12-13 ENCOUNTER — Other Ambulatory Visit: Payer: Self-pay | Admitting: Nurse Practitioner

## 2023-12-13 DIAGNOSIS — R928 Other abnormal and inconclusive findings on diagnostic imaging of breast: Secondary | ICD-10-CM

## 2023-12-14 ENCOUNTER — Encounter: Payer: Self-pay | Admitting: Internal Medicine

## 2023-12-14 ENCOUNTER — Other Ambulatory Visit: Payer: Medicaid Other

## 2023-12-14 ENCOUNTER — Ambulatory Visit (INDEPENDENT_AMBULATORY_CARE_PROVIDER_SITE_OTHER): Payer: Medicaid Other | Admitting: Internal Medicine

## 2023-12-14 VITALS — BP 114/76 | HR 75 | Ht 68.0 in | Wt 153.0 lb

## 2023-12-14 DIAGNOSIS — R519 Headache, unspecified: Secondary | ICD-10-CM

## 2023-12-14 DIAGNOSIS — R053 Chronic cough: Secondary | ICD-10-CM | POA: Diagnosis not present

## 2023-12-14 DIAGNOSIS — R1013 Epigastric pain: Secondary | ICD-10-CM

## 2023-12-14 DIAGNOSIS — Z1211 Encounter for screening for malignant neoplasm of colon: Secondary | ICD-10-CM

## 2023-12-14 DIAGNOSIS — Z8379 Family history of other diseases of the digestive system: Secondary | ICD-10-CM

## 2023-12-14 DIAGNOSIS — K219 Gastro-esophageal reflux disease without esophagitis: Secondary | ICD-10-CM | POA: Diagnosis not present

## 2023-12-14 DIAGNOSIS — R1011 Right upper quadrant pain: Secondary | ICD-10-CM

## 2023-12-14 DIAGNOSIS — R1084 Generalized abdominal pain: Secondary | ICD-10-CM | POA: Diagnosis not present

## 2023-12-14 MED ORDER — VONOPRAZAN FUMARATE 20 MG PO TABS: 20.0000 mg | ORAL_TABLET | Freq: Every day | ORAL | 1 refills | Status: DC

## 2023-12-14 MED ORDER — NA SULFATE-K SULFATE-MG SULF 17.5-3.13-1.6 GM/177ML PO SOLN
ORAL | 0 refills | Status: DC
Start: 2023-12-14 — End: 2024-02-09

## 2023-12-14 NOTE — Progress Notes (Unsigned)
12/14/2023 Lilou Mohamad Vernon [id number] October 22, 1976   CHIEF COMPLAINT: Heartburn, cough  HISTORY OF PRESENT ILLNESS: Yumi M. 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 No surgeries. She speaks Arabic therefore she presents accompanied by a [place] Arabic interpretor to facilitate communication. Her husband is also present. She presents to our office today as referred by pulmonologist Dr. Melody Comas for further evaluation regarding GERD. She has a history of asthma and a chronic cough x seven years for which she takes Flonase nasal spray, Advair inhaler, albuterol inhaler, Xyzal and Singulair as prescribed by her pulmonologist. Last took Prednisone 2 years ago. Her cough is triggered by perfumes scents. No specific food triggers Cough is notably worse in the morning. She has heartburn almost daily for many years. She was recently took Pantoprazole 40mg  every day x 2 months then stopped taking it 10 days ago as she did not think she needed it as her cough persisted but her heartburn improved. She denies having any dysphagia. No upper or lower abdominal pain. She is passing a normal formed brown bowel movement every day or every other day. No rectal bleeding or black stools. Never had an upper endoscopy or colonoscopy. No known family history of upper GI or colorectal cancer.  She endorses having frequent headaches.  No NSAID use.  Interval History: She was told that her mom and sister had hyatid cysts for which they had ot be treated. Her mother was treated in 2005 and sister was treated in 2002. Pantoprazole has not helped all. Flonase has helped somewhat but she has had some pain in her nose. She has some pain in her throat for 2 months.      Latest Ref Rng & Units 08/10/2023    9:50 AM 06/30/2022    9:50 AM 01/22/2022   12:30 PM  CBC  WBC  4.0 - 10.5 K/uL 5.2  5.8  6.7   Hemoglobin 12.0 - 15.0 g/dL 40.9  81.1  91.4   Hematocrit 36.0 - 46.0 % 40.2  40.2  42.2   Platelets 150.0 - 400.0 K/uL 219.0  211  220        Latest Ref Rng & Units 08/10/2023    9:50 AM 06/30/2022    9:50 AM 01/22/2022   12:30 PM  CMP  Glucose 70 - 99 mg/dL 91  93  782   BUN 6 - 23 mg/dL 8  8  <5   Creatinine 9.56 - 1.20 mg/dL 2.13  0.86  5.78   Sodium 135 - 145 mEq/L 138  137  138   Potassium 3.5 - 5.1 mEq/L 4.0  4.6  3.8   Chloride 96 - 112 mEq/L 106  103  107   CO2 19 - 32 mEq/L 24  22  23    Calcium 8.4 - 10.5 mg/dL 9.3  9.6  46.9   Total Protein 6.0 - 8.3 g/dL 6.6  6.8  7.1   Total Bilirubin 0.2 - 1.2 mg/dL 1.1  0.8  1.7   Alkaline Phos 39 - 117 U/L 43  67  56   AST 0 - 37 U/L 10  10  17    ALT 0 - 35 U/L 5  6  13      Social History: She is married.  She has 3 sons and 1 daughter.  Non-smoker.  No alcohol  use.  No drug use.  Family History:  No known family history or esophageal, gastric or colon cancer. Mother had stomach problems, no cancer.   No Known Allergies    Outpatient Encounter Medications as of 12/14/2023  Medication Sig   acetaminophen (TYLENOL) 500 MG tablet Take 1 tablet (500 mg total) by mouth every 6 (six) hours as needed.   albuterol (VENTOLIN HFA) 108 (90 Base) MCG/ACT inhaler Inhale 2 puffs into the lungs every 6 (six) hours as needed for wheezing or shortness of breath.   fluticasone (FLONASE) 50 MCG/ACT nasal spray Place 1 spray into both nostrils daily.   fluticasone-salmeterol (ADVAIR DISKUS) 250-50 MCG/ACT AEPB Inhale 1 puff into the lungs in the morning and at bedtime.   levocetirizine (XYZAL) 5 MG tablet Take 1 tablet (5 mg total) by mouth every evening.   montelukast (SINGULAIR) 10 MG tablet Take 1 tablet (10 mg total) by mouth at bedtime.   pantoprazole (PROTONIX) 40 MG tablet Take 1 tablet (40 mg total) by mouth 2 (two) times daily. Take 30 minutes before breakfast and dinner.   [DISCONTINUED]  amoxicillin-clavulanate (AUGMENTIN) 875-125 MG tablet Take 1 tablet by mouth 2 (two) times daily.   No facility-administered encounter medications on file as of 12/14/2023.    REVIEW OF SYSTEMS:  Gen: Denies fever, sweats or chills. No weight loss.  CV: Denies chest pain, palpitations or edema. Resp: Denies cough, shortness of breath of hemoptysis.  GI: See HPI. GU: Denies urinary burning, blood in urine, increased urinary frequency or incontinence. MS: Denies joint pain, muscles aches or weakness. Derm: Denies rash, itchiness, skin lesions or unhealing ulcers. Psych: Denies depression, anxiety, memory loss or confusion. Heme: Denies bruising, easy bleeding. Neuro:  + Headaches. Endo:  Denies any problems with DM, thyroid or adrenal function.  PHYSICAL EXAM: BP 114/76   Pulse 75   Ht 5\' 8"  (1.727 m)   Wt 153 lb (69.4 kg)   BMI 23.26 kg/m   General: 48 year old female in no acute distress. Head: Normocephalic and atraumatic. Eyes:  Sclerae non-icteric, conjunctive pink. Ears: Normal auditory acuity. Mouth: Dentition intact. No ulcers or lesions. No oral thrush.  Neck: Supple, no lymphadenopathy or thyromegaly.  Lungs: Clear bilaterally to auscultation without wheezes, crackles or rhonchi. Heart: Regular rate and rhythm. No murmur, rub or gallop appreciated.  Abdomen: Soft, nondistended. Tender in the RUQ, epigsatric ab pain, and LLQ. No masses. No hepatosplenomegaly. Normoactive bowel sounds x 4 quadrants.  Rectal: Deferred.  Musculoskeletal: Symmetrical with no gross deformities. Skin: Warm and dry. No rash or lesions on visible extremities. Extremities: No edema. Neurological: Alert oriented x 4, no focal deficits.  Psychological:  Alert and cooperative. Normal mood and affect.  EGD 08/30/23: - Normal esophagus. Biopsied. - Erythematous mucosa in the antrum. Biopsied. - Normal examined duodenum. Path: 1. Surgical [P], gastric antrum and gastric body :       GASTRIC  ANTRAL/OXYNTIC MUCOSA WITH NO SIGNIFICANT DIAGNOSTIC ALTERATION.       NO H.PYLORI IDENTIFIED ON H&E STAIN.       NEGATIVE FOR INTESTINAL METAPLASIA OR DYSPLASIA.       2. Surgical [P], esophagus :       ESOPHAGEAL SQUAMOUS MUCOSA WITH NO SIGNIFICANT DIAGNOSTIC ALTERATION.       NO EVIDENCE OF INTRAEPITHELIAL EOSINOPHILS OR LYMPHOCYTES.   ASSESSMENT AND PLAN:  48 year old female with GERD and a chronic cough. No significant improvement after taking Pantoprazole 40mg  every day x 2 months. Past Prednisone and Flonase  nasal spray.  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   CC:  Georganna Skeans, MD

## 2023-12-14 NOTE — Patient Instructions (Addendum)
Your provider has requested that you go to the basement level for lab work before leaving today. Press "B" on the elevator. The lab is located at the first door on the left as you exit the elevator.  You have been scheduled for an abdominal ultrasound at [facility] Radiology (1st floor of hospital) on 12/20/23 at 10. Please arrive 30 minutes prior to your appointment for registration. Make certain not to have anything to eat or drink after midnight. Should you need to reschedule your appointment, please contact radiology at [phone]. This test typically takes about 30 minutes to perform.   You have been scheduled for a colonoscopy. Please follow written instructions given to you at your visit today.   If you use inhalers (even only as needed), please bring them with you on the day of your procedure.  DO NOT TAKE 7 DAYS PRIOR TO TEST- Trulicity (dulaglutide) Ozempic, Wegovy (semaglutide) Mounjaro (tirzepatide) Bydureon Bcise (exanatide extended release)  DO NOT TAKE 1 DAY PRIOR TO YOUR TEST Rybelsus (semaglutide) Adlyxin (lixisenatide) Victoza (liraglutide) Byetta (exanatide)  We have sent the following medications to your pharmacy for you to pick up at your convenience: Suprep  vonaprazan 20 mg take every day for 8 weeks, then take  10 mg every day   _______________________________________________________  If your blood pressure at your visit was 140/90 or greater, please contact your primary care physician to follow up on this.  _______________________________________________________  If you are age 35 or older, your body mass index should be between 23-30. Your Body mass index is 23.26 kg/m. If this is out of the aforementioned range listed, please consider follow up with your Primary Care Provider.  If you are age 44 or younger, your body mass index should be between 19-25. Your Body mass index is 23.26 kg/m. If this is out of the aformentioned range listed, please consider  follow up with your Primary Care Provider.   ________________________________________________________  The [facility] providers would like to encourage you to use [facility] to communicate with providers for non-urgent requests or questions.  Due to long hold times on the telephone, sending your provider a message by [facility] may be a faster and more efficient way to get a response.  Please allow 48 business hours for a response.  Please remember that this is for non-urgent requests.  _______________________________________________________  Thank you for entrusting me with your care and for choosing [facility], Dr. Eulah Pont

## 2023-12-15 ENCOUNTER — Other Ambulatory Visit (HOSPITAL_COMMUNITY): Payer: Self-pay

## 2023-12-15 ENCOUNTER — Telehealth: Payer: Self-pay | Admitting: Pharmacy Technician

## 2023-12-15 LAB — IGA: Immunoglobulin A: 291 mg/dL (ref 47–310)

## 2023-12-15 NOTE — Telephone Encounter (Signed)
Pharmacy Patient Advocate Encounter   Received notification from Fax that prior authorization for VOQUEZNA 20MG  is required/requested.   Insurance verification completed.   The patient is insured through [facility] .   Per test claim: PA required; PA submitted to above mentioned insurance via CoverMyMeds Key/confirmation #/EOC [id number] Status is pending

## 2023-12-20 ENCOUNTER — Ambulatory Visit (HOSPITAL_COMMUNITY)
Admission: RE | Admit: 2023-12-20 | Discharge: 2023-12-20 | Disposition: A | Discharge status: Home / Self Care | Payer: Medicaid Other | Source: Ambulatory Visit | Attending: Internal Medicine | Admitting: Internal Medicine

## 2023-12-20 DIAGNOSIS — Z1211 Encounter for screening for malignant neoplasm of colon: Secondary | ICD-10-CM | POA: Insufficient documentation

## 2023-12-20 DIAGNOSIS — K219 Gastro-esophageal reflux disease without esophagitis: Secondary | ICD-10-CM | POA: Insufficient documentation

## 2023-12-20 DIAGNOSIS — R1011 Right upper quadrant pain: Secondary | ICD-10-CM | POA: Insufficient documentation

## 2023-12-20 DIAGNOSIS — R519 Headache, unspecified: Secondary | ICD-10-CM | POA: Diagnosis present

## 2023-12-20 DIAGNOSIS — R1013 Epigastric pain: Secondary | ICD-10-CM | POA: Insufficient documentation

## 2024-01-11 ENCOUNTER — Encounter: Payer: Self-pay | Admitting: Family Medicine

## 2024-01-11 ENCOUNTER — Ambulatory Visit: Payer: Medicaid Other | Admitting: Family Medicine

## 2024-01-11 VITALS — BP 110/75 | HR 76 | Resp 16 | Wt 153.0 lb

## 2024-01-11 DIAGNOSIS — Z789 Other specified health status: Secondary | ICD-10-CM | POA: Diagnosis not present

## 2024-01-11 DIAGNOSIS — J029 Acute pharyngitis, unspecified: Secondary | ICD-10-CM

## 2024-01-11 DIAGNOSIS — D649 Anemia, unspecified: Secondary | ICD-10-CM

## 2024-01-11 LAB — POCT RAPID STREP A DIPSTICK TEST: Rapid Strep A Screen: NEGATIVE

## 2024-01-11 MED ORDER — AZITHROMYCIN 250 MG PO TABS: ORAL_TABLET | ORAL | 0 refills | Status: AC

## 2024-01-11 MED ORDER — IRON (FERROUS SULFATE) 325 (65 FE) MG PO TABS: 325.0000 mg | ORAL_TABLET | Freq: Every day | ORAL | 5 refills | Status: DC

## 2024-01-11 NOTE — Progress Notes (Unsigned)
 POCT attempted on patient but was not able to get a good result due to patient having " gag" problems

## 2024-01-11 NOTE — Progress Notes (Unsigned)
 Established Patient Office Visit  Subjective    Patient ID: Marcia Miller, female    DOB: [birth year]  Age: 48 y.o. MRN: [id number]  CC:  Chief Complaint  Patient presents with   Follow-up    3 month, seasonal allergies, sore throat , feels like she has to vomit    HPI Marcia Miller presents with complaint of sore throat. She denies fever/chills or viral sx. She reports that she feels as if she is gagging at times. She also reports that another provider let her know that she has low iron. This visit was aided by an interpreter.   Outpatient Encounter Medications as of 01/11/2024  Medication Sig   acetaminophen (TYLENOL) 500 MG tablet Take 1 tablet (500 mg total) by mouth every 6 (six) hours as needed.   albuterol (VENTOLIN HFA) 108 (90 Base) MCG/ACT inhaler Inhale 2 puffs into the lungs every 6 (six) hours as needed for wheezing or shortness of breath.   azithromycin (ZITHROMAX) 250 MG tablet Take 2 tablets on day 1, then 1 tablet daily on days 2 through 5   fluticasone (FLONASE) 50 MCG/ACT nasal spray Place 1 spray into both nostrils daily.   fluticasone-salmeterol (ADVAIR DISKUS) 250-50 MCG/ACT AEPB Inhale 1 puff into the lungs in the morning and at bedtime.   Iron, Ferrous Sulfate, 325 (65 Fe) MG TABS Take 325 mg by mouth daily.   levocetirizine (XYZAL) 5 MG tablet Take 1 tablet (5 mg total) by mouth every evening.   montelukast (SINGULAIR) 10 MG tablet Take 1 tablet (10 mg total) by mouth at bedtime.   Na Sulfate-K Sulfate-Mg Sulfate concentrate 17.5-3.13-1.6 GM/177ML SOLN Use as directed; may use generic; goodrx card if insurance will not cover generic   Vonoprazan Fumarate 20 MG TABS Take 20 mg by mouth daily.   No facility-administered encounter medications on file as of 01/11/2024.    Past Medical History:  Diagnosis Date   Asthma    Tuberculosis     History reviewed. No pertinent surgical history.  Family History  Problem Relation Age of Onset    Liver disease Mother        ascites   Liver disease Sister        ascites    Social History   Socioeconomic History   Marital status: Married    Spouse name: Not on file   Number of children: Not on file   Years of education: Not on file   Highest education level: Not on file  Occupational History   Not on file  Tobacco Use   Smoking status: Never    Passive exposure: Never   Smokeless tobacco: Never  Vaping Use   Vaping status: Never Used  Substance and Sexual Activity   Alcohol use: Never   Drug use: Never   Sexual activity: Yes    Partners: Male    Birth control/protection: I.U.D.  Other Topics Concern   Not on file  Social History Narrative   Not on file   Social Drivers of Health   Financial Resource Strain: Low Risk  (08/11/2023)   Overall Financial Resource Strain (CARDIA)    Difficulty of Paying Living Expenses: Not hard at all  Food Insecurity: No Food Insecurity (08/11/2023)   Hunger Vital Sign    Worried About Running Out of Food in the Last Year: Never true    Ran Out of Food in the Last Year: Never true  Transportation Needs: No Transportation Needs (08/11/2023)   PRAPARE -  Administrator, Civil Service (Medical): No    Lack of Transportation (Non-Medical): No  Physical Activity: Inactive (08/11/2023)   Exercise Vital Sign    Days of Exercise per Week: 0 days    Minutes of Exercise per Session: 0 min  Stress: No Stress Concern Present (08/11/2023)   [employer] of Occupational Health - Occupational Stress Questionnaire    Feeling of Stress : Not at all  Social Connections: Not on file  Intimate Partner Violence: Not At Risk (08/11/2023)   Humiliation, Afraid, Rape, and Kick questionnaire    Fear of Current or Ex-Partner: No    Emotionally Abused: No    Physically Abused: No    Sexually Abused: No    Review of Systems  HENT:  Positive for sore throat.   All other systems reviewed and are negative.       Objective     BP 110/75   Pulse 76   Resp 16   Wt 153 lb (69.4 kg)   SpO2 97%   BMI 23.26 kg/m   Physical Exam Vitals and nursing note reviewed.  Constitutional:      General: She is not in acute distress. HENT:     Mouth/Throat:     Pharynx: Posterior oropharyngeal erythema present.  Cardiovascular:     Rate and Rhythm: Normal rate and regular rhythm.  Pulmonary:     Effort: Pulmonary effort is normal.     Breath sounds: Normal breath sounds.  Abdominal:     Palpations: Abdomen is soft.     Tenderness: There is no abdominal tenderness.  Neurological:     General: No focal deficit present.     Mental Status: She is alert and oriented to person, place, and time.         Assessment & Plan:  1. Acute pharyngitis, unspecified etiology (Primary) Zithromax prescribed. Will consider ENT referral if sx persist/worsen.  - POCT Rapid Strep A Dipstick Test  2. Anemia, unspecified type Iron prescribed.   3. Language barrier to communication     Return in about 3 months (around 04/12/2024) for follow up.   Tommie Raymond, MD

## 2024-01-12 ENCOUNTER — Encounter: Payer: Self-pay | Admitting: Family Medicine

## 2024-01-17 ENCOUNTER — Encounter: Payer: Medicaid Other | Admitting: Internal Medicine

## 2024-01-18 NOTE — Congregational Nurse Program (Signed)
  Dept: [phone]   Congregational Nurse Program Note  Date of Encounter: 01/18/2024  Past Medical History: Past Medical History:  Diagnosis Date   Asthma    Tuberculosis    Patient came to establish care with Congregational Nurse. She is complaining of pain to her knees and back. She has done x-rays and is [facility]. She says that her PCP is "[facility]". She has appointment dates and time.I will continue to assist as needed.  Encounter Details:  Community Questionnaire - 01/18/24 1321       Questionnaire   Ask client: Do you give verbal consent for me to treat you today? Yes    Student Assistance N/A    Location Patient Served  [facility]    Encounter Setting CN site    Population Status Migrant/Refugee    Insurance Medicaid    Insurance/Financial Assistance Referral N/A    Medication N/A    Medical Provider Yes    Screening Referrals Made N/A    Medical Referrals Made [facility] PCP[facility]Clinic    Medical Appointment Completed Non-[facility] PCP/Clinic    CNP Interventions Advocate/Support;Navigate Healthcare System;Case Management;Counsel;Educate    Screenings CN Performed Blood Pressure    ED Visit Averted Yes    Life-Saving Intervention Made N/A            Arman Bogus RN BSN PCCN  [facility] [phone] [phone]-office

## 2024-01-20 ENCOUNTER — Other Ambulatory Visit

## 2024-01-20 ENCOUNTER — Encounter

## 2024-01-23 ENCOUNTER — Telehealth: Payer: Self-pay

## 2024-01-23 NOTE — Telephone Encounter (Signed)
 Pharmacy Patient Advocate Encounter   Received notification from CoverMyMeds that prior authorization for Voquezna 20MG  tablets is required/requested.   Insurance verification completed.   The patient is insured through [facility] .   Per test claim: PA required; PA submitted to above mentioned insurance via CoverMyMeds Key/confirmation #/EOC [id number] Status is pending  *Resubmission since not hearing back from original request

## 2024-01-24 ENCOUNTER — Telehealth: Payer: Self-pay

## 2024-01-24 NOTE — Telephone Encounter (Signed)
 Spoke to patient with interpreter on the line advised received notification from [facility] that Prior Authorization for Voquezna 20MG  tablets has been APPROVED from 01-23-2024 to 02-23-2024. Patient verbalized understanding

## 2024-01-24 NOTE — Telephone Encounter (Signed)
 Pharmacy Patient Advocate Encounter  Received notification from [facility] that Prior Authorization for Voquezna 20MG  tablets has been APPROVED from 01-23-2024 to 02-23-2024   PA #/Case ID/Reference #: [id number]

## 2024-02-08 ENCOUNTER — Ambulatory Visit: Payer: Medicaid Other | Admitting: Obstetrics and Gynecology

## 2024-02-09 ENCOUNTER — Ambulatory Visit: Payer: Medicaid Other | Admitting: Internal Medicine

## 2024-02-09 ENCOUNTER — Encounter: Payer: Self-pay | Admitting: Internal Medicine

## 2024-02-09 VITALS — BP 106/67 | HR 49 | Temp 98.1°F | Resp 11 | Ht 68.0 in | Wt 153.0 lb

## 2024-02-09 DIAGNOSIS — K648 Other hemorrhoids: Secondary | ICD-10-CM

## 2024-02-09 DIAGNOSIS — Z1211 Encounter for screening for malignant neoplasm of colon: Secondary | ICD-10-CM

## 2024-02-09 DIAGNOSIS — K529 Noninfective gastroenteritis and colitis, unspecified: Secondary | ICD-10-CM

## 2024-02-09 MED ORDER — SODIUM CHLORIDE 0.9 % IV SOLN: 500.0000 mL | Freq: Once | INTRAVENOUS | Status: DC

## 2024-02-09 MED ORDER — ESOMEPRAZOLE MAGNESIUM 40 MG PO CPDR: 40.0000 mg | DELAYED_RELEASE_CAPSULE | Freq: Two times a day (BID) | ORAL | 2 refills | Status: DC

## 2024-02-09 NOTE — Progress Notes (Signed)
 Vss nad trans to pacu

## 2024-02-09 NOTE — Progress Notes (Signed)
 OV F/U 2-3 months  Nexium 40 mg BID #60. 2 refills  VO Dr. Bobette Mo RN   EGD with [facility] not scheduled d/t pt refusal   Pt was taken to the bathroom prior to discharge- able to pass large amt of air per pt

## 2024-02-09 NOTE — Progress Notes (Signed)
 Called to room to assist during endoscopic procedure.  Patient ID and intended procedure confirmed with present staff. Received instructions for my participation in the procedure from the performing physician.

## 2024-02-09 NOTE — Progress Notes (Signed)
 GASTROENTEROLOGY PROCEDURE H&P NOTE   Primary Care Physician: Georganna Skeans, MD    Reason for Procedure:   Colon cancer screening  Plan:    Colonoscopy  Patient is appropriate for endoscopic procedure(s) in the ambulatory ([facility]) setting.  The nature of the procedure, as well as the risks, benefits, and alternatives were carefully and thoroughly reviewed with the patient. Ample time for discussion and questions allowed. The patient understood, was satisfied, and agreed to proceed.     HPI: Marcia Miller is a 48 y.o. female who presents for colonoscopy for evaluation of colon cancer screening.  Patient was most recently seen in the Gastroenterology Clinic on 12/14/23.  No interval change in medical history since that appointment. Please refer to that note for full details regarding GI history and clinical presentation.   Past Medical History:  Diagnosis Date   Asthma    Tuberculosis     History reviewed. No pertinent surgical history.  Prior to Admission medications   Medication Sig Start Date End Date Taking? Authorizing Provider  acetaminophen (TYLENOL) 500 MG tablet Take 1 tablet (500 mg total) by mouth every 6 (six) hours as needed. 05/11/23  Yes Zonia Kief, Amy J, NP  albuterol (VENTOLIN HFA) 108 (90 Base) MCG/ACT inhaler Inhale 2 puffs into the lungs every 6 (six) hours as needed for wheezing or shortness of breath. 06/10/23  Yes Glenford Bayley, NP  fluticasone (FLONASE) 50 MCG/ACT nasal spray Place 1 spray into both nostrils daily. 08/11/23  Yes Georganna Skeans, MD  fluticasone-salmeterol (ADVAIR DISKUS) 250-50 MCG/ACT AEPB Inhale 1 puff into the lungs in the morning and at bedtime. 08/11/23  Yes Georganna Skeans, MD  Iron, Ferrous Sulfate, 325 (65 Fe) MG TABS Take 325 mg by mouth daily. 01/11/24  Yes Georganna Skeans, MD  levocetirizine (XYZAL) 5 MG tablet Take 1 tablet (5 mg total) by mouth every evening. 08/11/23  Yes Georganna Skeans, MD  montelukast (SINGULAIR) 10  MG tablet Take 1 tablet (10 mg total) by mouth at bedtime. 08/11/23  Yes Georganna Skeans, MD    Current Outpatient Medications  Medication Sig Dispense Refill   acetaminophen (TYLENOL) 500 MG tablet Take 1 tablet (500 mg total) by mouth every 6 (six) hours as needed. 30 tablet 1   albuterol (VENTOLIN HFA) 108 (90 Base) MCG/ACT inhaler Inhale 2 puffs into the lungs every 6 (six) hours as needed for wheezing or shortness of breath. 8 g 1   fluticasone (FLONASE) 50 MCG/ACT nasal spray Place 1 spray into both nostrils daily. 16 g 2   fluticasone-salmeterol (ADVAIR DISKUS) 250-50 MCG/ACT AEPB Inhale 1 puff into the lungs in the morning and at bedtime. 60 each 5   Iron, Ferrous Sulfate, 325 (65 Fe) MG TABS Take 325 mg by mouth daily. 30 tablet 5   levocetirizine (XYZAL) 5 MG tablet Take 1 tablet (5 mg total) by mouth every evening. 90 tablet 1   montelukast (SINGULAIR) 10 MG tablet Take 1 tablet (10 mg total) by mouth at bedtime. 90 tablet 3   Current Facility-Administered Medications  Medication Dose Route Frequency Provider Last Rate Last Admin   0.9 %  sodium chloride infusion  500 mL Intravenous Once Imogene Burn, MD        Allergies as of 02/09/2024   (No Known Allergies)    Family History  Problem Relation Age of Onset   Liver disease Mother        ascites   Liver disease Sister  ascites    Social History   Socioeconomic History   Marital status: Married    Spouse name: Not on file   Number of children: Not on file   Years of education: Not on file   Highest education level: Not on file  Occupational History   Not on file  Tobacco Use   Smoking status: Never    Passive exposure: Never   Smokeless tobacco: Never  Vaping Use   Vaping status: Never Used  Substance and Sexual Activity   Alcohol use: Never   Drug use: Never   Sexual activity: Yes    Partners: Male    Birth control/protection: I.U.D.  Other Topics Concern   Not on file  Social History Narrative    Not on file   Social Drivers of Health   Financial Resource Strain: Low Risk  (08/11/2023)   Overall Financial Resource Strain (CARDIA)    Difficulty of Paying Living Expenses: Not hard at all  Food Insecurity: No Food Insecurity (08/11/2023)   Hunger Vital Sign    Worried About Running Out of Food in the Last Year: Never true    Ran Out of Food in the Last Year: Never true  Transportation Needs: No Transportation Needs (08/11/2023)   PRAPARE - Administrator, Civil Service (Medical): No    Lack of Transportation (Non-Medical): No  Physical Activity: Inactive (08/11/2023)   Exercise Vital Sign    Days of Exercise per Week: 0 days    Minutes of Exercise per Session: 0 min  Stress: No Stress Concern Present (08/11/2023)   [employer] of Occupational Health - Occupational Stress Questionnaire    Feeling of Stress : Not at all  Social Connections: Not on file  Intimate Partner Violence: Not At Risk (08/11/2023)   Humiliation, Afraid, Rape, and Kick questionnaire    Fear of Current or Ex-Partner: No    Emotionally Abused: No    Physically Abused: No    Sexually Abused: No    Physical Exam: Vital signs in last 24 hours: BP (!) 118/53   Pulse 79   Temp 98.1 F (36.7 C)   Ht 5\' 8"  (1.727 m)   Wt 153 lb (69.4 kg)   SpO2 100%   BMI 23.26 kg/m  GEN: NAD EYE: Sclerae anicteric ENT: MMM CV: Non-tachycardic Pulm: No increased WOB GI: Soft NEURO:  Alert & Oriented   Eulah Pont, MD [facility]   02/09/2024 1:19 PM

## 2024-02-09 NOTE — Progress Notes (Signed)
Interpreter used today at the [facility] for this pt.  Interpreter's name is- Architect

## 2024-02-09 NOTE — Patient Instructions (Addendum)
 Await pathology results   Take Nexium 40 mg- 1 tablet 30  minutes before breakfast and dinner  YOU HAD AN ENDOSCOPIC PROCEDURE TODAY AT THE [facility]:   Refer to the procedure report that was given to you for any specific questions about what was found during the examination.  If the procedure report does not answer your questions, please call your gastroenterologist to clarify.  If you requested that your care partner not be given the details of your procedure findings, then the procedure report has been included in a sealed envelope for you to review at your convenience later.  YOU SHOULD EXPECT: Some feelings of bloating in the abdomen. Passage of more gas than usual.  Walking can help get rid of the air that was put into your GI tract during the procedure and reduce the bloating. If you had a lower endoscopy (such as a colonoscopy or flexible sigmoidoscopy) you may notice spotting of blood in your stool or on the toilet paper. If you underwent a bowel prep for your procedure, you may not have a normal bowel movement for a few days.  Please Note:  You might notice some irritation and congestion in your nose or some drainage.  This is from the oxygen used during your procedure.  There is no need for concern and it should clear up in a day or so.  SYMPTOMS TO REPORT IMMEDIATELY:  Following lower endoscopy (colonoscopy or flexible sigmoidoscopy):  Excessive amounts of blood in the stool  Significant tenderness or worsening of abdominal pains  Swelling of the abdomen that is new, acute  Fever of 100F or higher  For urgent or emergent issues, a gastroenterologist can be reached at any hour by calling [phone]. Do not use MyChart messaging for urgent concerns.    DIET:  We do recommend a small meal at first, but then you may proceed to your regular diet.  Drink plenty of fluids but you should avoid alcoholic beverages for 24 hours.  ACTIVITY:  You should plan to take it  easy for the rest of today and you should NOT DRIVE or use heavy machinery until tomorrow (because of the sedation medicines used during the test).    FOLLOW UP: Our staff will call the number listed on your records the next business day following your procedure.  We will call around 7:15- 8:00 am to check on you and address any questions or concerns that you may have regarding the information given to you following your procedure. If we do not reach you, we will leave a message.     If any biopsies were taken you will be contacted by phone or by letter within the next 1-3 weeks.  Please call us at [phone] if you have not heard about the biopsies in 3 weeks.    SIGNATURES/CONFIDENTIALITY: You and/or your care partner have signed paperwork which will be entered into your electronic medical record.  These signatures attest to the fact that that the information above on your After Visit Summary has been reviewed and is understood.  Full responsibility of the confidentiality of this discharge information lies with you and/or your care-partner.

## 2024-02-09 NOTE — Op Note (Addendum)
 [facility] Patient Name: Marcia Miller Procedure Date: 02/09/2024 1:17 PM MRN: [id number] Endoscopist: Particia Lather , , [id number] Age: 48 Referring MD:  Date of Birth: November 27, 1975 Gender: Female Account #: [id number] Procedure:                Colonoscopy Indications:              Screening for colorectal malignant neoplasm, This                            is the patient's first colonoscopy Medicines:                Monitored Anesthesia Care Procedure:                Pre-Anesthesia Assessment:                           - Prior to the procedure, a History and Physical                            was performed, and patient medications and                            allergies were reviewed. The patient's tolerance of                            previous anesthesia was also reviewed. The risks                            and benefits of the procedure and the sedation                            options and risks were discussed with the patient.                            All questions were answered, and informed consent                            was obtained. Prior Anticoagulants: The patient has                            taken no anticoagulant or antiplatelet agents. ASA                            Grade Assessment: II - A patient with mild systemic                            disease. After reviewing the risks and benefits,                            the patient was deemed in satisfactory condition to                            undergo the procedure.  After obtaining informed consent, the colonoscope                            was passed under direct vision. Throughout the                            procedure, the patient's blood pressure, pulse, and                            oxygen saturations were monitored continuously. The                            CF [id number] #[id number] was introduced through the anus                            and advanced  to the the terminal ileum. The                            colonoscopy was performed without difficulty. The                            patient tolerated the procedure well. The quality                            of the bowel preparation was good. The terminal                            ileum, ileocecal valve, appendiceal orifice, and                            rectum were photographed. Scope In: 1:23:21 PM Scope Out: 1:40:34 PM Scope Withdrawal Time: 0 hours 12 minutes 19 seconds  Total Procedure Duration: 0 hours 17 minutes 13 seconds  Findings:                 The terminal ileum contained a few localized                            non-bleeding erosions. No stigmata of recent                            bleeding were seen. Biopsies were taken with a cold                            forceps for histology.                           Non-bleeding internal hemorrhoids were found during                            retroflexion. Complications:            No immediate complications. Estimated Blood Loss:     Estimated blood loss was minimal. Impression:               - A few erosions  in the terminal ileum. Biopsied.                           - Non-bleeding internal hemorrhoids. Recommendation:           - Discharge patient to home (with escort).                           - Await pathology results.                           - Patient was not able to tolerate vonoprazan. She                            declined EGD with BRAVO at this time. Will stop her                            vonoprazan and switch to Nexium 40 mg BID.                           - Return to GI clinic in 2-3 months.                           - The findings and recommendations were discussed                            with the patient. Dr Particia Lather "[name]" Leonides Schanz,  02/09/2024 1:49:25 PM

## 2024-02-10 ENCOUNTER — Telehealth: Payer: Self-pay

## 2024-02-10 NOTE — Telephone Encounter (Signed)
No answer on follow up call. 

## 2024-02-14 ENCOUNTER — Ambulatory Visit
Admission: RE | Admit: 2024-02-14 | Discharge: 2024-02-14 | Disposition: A | Discharge status: Home / Self Care | Source: Ambulatory Visit | Attending: Nurse Practitioner | Admitting: Nurse Practitioner

## 2024-02-14 ENCOUNTER — Encounter: Payer: Self-pay | Admitting: Internal Medicine

## 2024-02-14 ENCOUNTER — Ambulatory Visit: Admission: RE | Admit: 2024-02-14 | Source: Ambulatory Visit

## 2024-02-14 DIAGNOSIS — R928 Other abnormal and inconclusive findings on diagnostic imaging of breast: Secondary | ICD-10-CM

## 2024-02-14 LAB — SURGICAL PATHOLOGY

## 2024-03-08 ENCOUNTER — Other Ambulatory Visit: Payer: Self-pay | Admitting: Internal Medicine

## 2024-03-09 ENCOUNTER — Other Ambulatory Visit (HOSPITAL_COMMUNITY): Payer: Self-pay

## 2024-03-09 ENCOUNTER — Ambulatory Visit: Admitting: Obstetrics and Gynecology

## 2024-03-09 ENCOUNTER — Telehealth: Payer: Self-pay

## 2024-03-09 NOTE — Telephone Encounter (Signed)
 Pharmacy Patient Advocate Encounter  Received notification from [facility] that Prior Authorization for Voquezna  20MG  tablets has been APPROVED from 03-09-2024 to 03-09-2025   PA #/Case ID/Reference #: [id number]

## 2024-03-09 NOTE — Telephone Encounter (Signed)
 Pharmacy Patient Advocate Encounter   Received notification from CoverMyMeds that prior authorization for Voquezna  20MG  tablets is required/requested.   Insurance verification completed.   The patient is insured through [facility] .   Per test claim: PA required; PA submitted to above mentioned insurance via CoverMyMeds Key/confirmation #/EOC [id number] Status is pending

## 2024-03-09 NOTE — Progress Notes (Deleted)
 ANNUAL EXAM Patient name: Marcia Miller MRN [id number]  Date of birth: 1976-09-06 Chief Complaint:   No chief complaint on file.  History of Present Illness:   Marcia Miller is a 48 y.o. G4P0 being seen today for a routine annual exam.  Current complaints: ***  Menstrual concerns? {yes/no:20286}  *** Breast or nipple changes? {yes/no:20286} *** Contraception use? {yes/no:20286} *** Sexually active? {yes/no:20286} ***  No LMP recorded. (Menstrual status: IUD).   The pregnancy intention screening data noted above was reviewed. Potential methods of contraception were discussed. The patient elected to proceed with No data recorded.   Last pap     Component Value Date/Time   DIAGPAP  10/04/2022 0922    - Negative for Intraepithelial Lesions or Malignancy (NILM)   DIAGPAP - Benign reactive/reparative changes 10/04/2022 0922   HPVHIGH Negative 10/04/2022 0922   ADEQPAP  10/04/2022 0922    Satisfactory for evaluation; transformation zone component PRESENT.     H/O abnormal pap: {yes/yes***/no:23866} Last mammogram: 01/2024 birads 1.  Last colonoscopy: ***.      01/11/2024    8:43 AM 10/12/2023    8:42 AM 08/11/2023    8:58 AM 04/12/2023    8:43 AM 11/11/2022    8:44 AM  Depression screen PHQ 2/9  Decreased Interest 0 1 0 0 0  Down, Depressed, Hopeless 0 0 0 0 0  PHQ - 2 Score 0 1 0 0 0  Altered sleeping    0 0  Tired, decreased energy    0 0  Change in appetite    0 0  Feeling bad or failure about yourself     0 0  Trouble concentrating    0 0  Moving slowly or fidgety/restless    0 0  Suicidal thoughts    0 0  PHQ-9 Score    0 0  Difficult doing work/chores     Not difficult at all        01/11/2024    8:44 AM 10/12/2023    8:45 AM 11/11/2022    8:44 AM 10/04/2022    9:18 AM  GAD 7 : Generalized Anxiety Score  Nervous, Anxious, on Edge 0 1 0 0  Control/stop worrying 1 0 0 0  Worry too much - different things 0 0 0 0  Trouble relaxing 0 0 0 0   Restless 0 0 0 0  Easily annoyed or irritable 0 0 0 0  Afraid - awful might happen 0 1  0  Total GAD 7 Score 1 2  0  Anxiety Difficulty Somewhat difficult Somewhat difficult Not difficult at all      Review of Systems:   Pertinent items are noted in HPI Denies any headaches, blurred vision, fatigue, shortness of breath, chest pain, abdominal pain, abnormal vaginal discharge/itching/odor/irritation, problems with periods, bowel movements, urination, or intercourse unless otherwise stated above. Pertinent History Reviewed:  Reviewed past medical,surgical, social and family history.  Reviewed problem list, medications and allergies. Physical Assessment:  There were no vitals filed for this visit.There is no height or weight on file to calculate BMI.        Physical Examination:   General appearance - well appearing, and in no distress  Mental status - alert, oriented to person, place, and time  Psych:  She has a normal mood and affect  Skin - warm and dry, normal color, no suspicious lesions noted  Chest - effort normal, all lung fields clear to auscultation bilaterally  Heart -  normal rate and regular rhythm  Breasts - breasts appear normal, no suspicious masses, no skin or nipple changes or  axillary nodes  Abdomen - soft, nontender, nondistended, no masses or organomegaly  Pelvic -  VULVA: normal appearing vulva with no masses, tenderness or lesions   VAGINA: normal appearing vagina with normal color and discharge, no lesions   CERVIX: normal appearing cervix without discharge or lesions, no CMT  Thin prep pap is {Desc; done/not:10129} *** HR HPV cotesting  UTERUS: uterus is felt to be normal size, shape, consistency and nontender   ADNEXA: No adnexal masses or tenderness noted.  Extremities:  No swelling or varicosities noted  Chaperone present for exam  No results found for this or any previous visit (from the past 24 hours).    Assessment & Plan:  There are no diagnoses  linked to this encounter.      No orders of the defined types were placed in this encounter.   Meds: No orders of the defined types were placed in this encounter.   Follow-up: No follow-ups on file.  Kiki Pelton, MD 03/09/2024 8:16 AM

## 2024-03-22 ENCOUNTER — Ambulatory Visit: Admitting: Obstetrics and Gynecology

## 2024-03-22 ENCOUNTER — Encounter: Payer: Self-pay | Admitting: Obstetrics and Gynecology

## 2024-03-22 VITALS — BP 113/79 | HR 66 | Wt 152.0 lb

## 2024-03-22 DIAGNOSIS — N8111 Cystocele, midline: Secondary | ICD-10-CM | POA: Diagnosis not present

## 2024-03-22 DIAGNOSIS — Z01419 Encounter for gynecological examination (general) (routine) without abnormal findings: Secondary | ICD-10-CM | POA: Diagnosis not present

## 2024-03-22 DIAGNOSIS — Z975 Presence of (intrauterine) contraceptive device: Secondary | ICD-10-CM

## 2024-03-22 NOTE — Progress Notes (Signed)
 ANNUAL GYNECOLOGY VISIT Chief Complaint  Patient presents with   Annual Exam     Subjective:  Marcia Miller is a 48 y.o. who presents for annual exam  In person interpreter Rawan used for today's visit.  She reports IUD that she has had for 8 years. Uncertain what type. Placed in [place]. Was told it was good for 12 years.   Also notes pelvic pressure and bulge. Saw Dr. Dodie Frees for the same concern and was referred to urogyn but states she never got called.  Gyn History: Patient's last menstrual period was 03/17/2024. Sexually active: yes/no: Yes Contraception: IUD History of STIs: No Last pap:  Lab Results  Component Value Date   DIAGPAP  10/04/2022    - Negative for Intraepithelial Lesions or Malignancy (NILM)   DIAGPAP - Benign reactive/reparative changes 10/04/2022   HPVHIGH Negative 10/04/2022   History of abnormal pap: No Periods: regular, monthly Last mammogram: 2025 Last colonoscopy: 2025 Last DEXA: n/a   The pregnancy intention screening data noted above was reviewed. Potential methods of contraception were discussed. The patient elected to proceed with No data recorded.       01/11/2024    8:43 AM 10/12/2023    8:42 AM 08/11/2023    8:58 AM 04/12/2023    8:43 AM 11/11/2022    8:44 AM  Depression screen PHQ 2/9  Decreased Interest 0 1 0 0 0  Down, Depressed, Hopeless 0 0 0 0 0  PHQ - 2 Score 0 1 0 0 0  Altered sleeping    0 0  Tired, decreased energy    0 0  Change in appetite    0 0  Feeling bad or failure about yourself     0 0  Trouble concentrating    0 0  Moving slowly or fidgety/restless    0 0  Suicidal thoughts    0 0  PHQ-9 Score    0 0  Difficult doing work/chores     Not difficult at all        01/11/2024    8:44 AM 10/12/2023    8:45 AM 11/11/2022    8:44 AM 10/04/2022    9:18 AM  GAD 7 : Generalized Anxiety Score  Nervous, Anxious, on Edge 0 1 0 0  Control/stop worrying 1 0 0 0  Worry too much - different things 0 0  0 0  Trouble relaxing 0 0 0 0  Restless 0 0 0 0  Easily annoyed or irritable 0 0 0 0  Afraid - awful might happen 0 1  0  Total GAD 7 Score 1 2  0  Anxiety Difficulty Somewhat difficult Somewhat difficult Not difficult at all       OB History     Gravida  4   Para      Term      Preterm      AB      Living         SAB      IAB      Ectopic      Multiple      Live Births              Past Medical History:  Diagnosis Date   Asthma    Tuberculosis     History reviewed. No pertinent surgical history.  Social History   Socioeconomic History   Marital status: Married    Spouse name: Not on file   Number  of children: Not on file   Years of education: Not on file   Highest education level: Not on file  Occupational History   Not on file  Tobacco Use   Smoking status: Never    Passive exposure: Never   Smokeless tobacco: Never  Vaping Use   Vaping status: Never Used  Substance and Sexual Activity   Alcohol use: Never   Drug use: Never   Sexual activity: Yes    Partners: Male    Birth control/protection: I.U.D.  Other Topics Concern   Not on file  Social History Narrative   Not on file   Social Drivers of Health   Financial Resource Strain: Low Risk  (08/11/2023)   Overall Financial Resource Strain (CARDIA)    Difficulty of Paying Living Expenses: Not hard at all  Food Insecurity: No Food Insecurity (08/11/2023)   Hunger Vital Sign    Worried About Running Out of Food in the Last Year: Never true    Ran Out of Food in the Last Year: Never true  Transportation Needs: No Transportation Needs (08/11/2023)   PRAPARE - Administrator, Civil Service (Medical): No    Lack of Transportation (Non-Medical): No  Physical Activity: Inactive (08/11/2023)   Exercise Vital Sign    Days of Exercise per Week: 0 days    Minutes of Exercise per Session: 0 min  Stress: No Stress Concern Present (08/11/2023)   [employer] of  Occupational Health - Occupational Stress Questionnaire    Feeling of Stress : Not at all  Social Connections: Not on file    Family History  Problem Relation Age of Onset   Liver disease Mother        ascites   Liver disease Sister        ascites    Current Outpatient Medications on File Prior to Visit  Medication Sig Dispense Refill   Iron , Ferrous Sulfate , 325 (65 Fe) MG TABS Take 325 mg by mouth daily. 30 tablet 5   acetaminophen  (TYLENOL ) 500 MG tablet Take 1 tablet (500 mg total) by mouth every 6 (six) hours as needed. 30 tablet 1   albuterol  (VENTOLIN  HFA) 108 (90 Base) MCG/ACT inhaler Inhale 2 puffs into the lungs every 6 (six) hours as needed for wheezing or shortness of breath. 8 g 1   esomeprazole  (NEXIUM ) 40 MG capsule Take 1 capsule (40 mg total) by mouth 2 (two) times daily before a meal. 60 capsule 2   fluticasone  (FLONASE ) 50 MCG/ACT nasal spray Place 1 spray into both nostrils daily. 16 g 2   fluticasone -salmeterol (ADVAIR DISKUS) 250-50 MCG/ACT AEPB Inhale 1 puff into the lungs in the morning and at bedtime. 60 each 5   levocetirizine (XYZAL ) 5 MG tablet Take 1 tablet (5 mg total) by mouth every evening. 90 tablet 1   montelukast  (SINGULAIR ) 10 MG tablet Take 1 tablet (10 mg total) by mouth at bedtime. 90 tablet 3   VOQUEZNA  20 MG TABS TAKE 1 TABLET BY MOUTH DAILY( FOR 8 WEEKS THEN TAKE 10 MG EVERY DAY) 30 tablet 1   No current facility-administered medications on file prior to visit.    No Known Allergies   Objective:   Vitals:   03/22/24 1133  BP: 113/79  Pulse: 66  Weight: 152 lb (68.9 kg)   Physical Examination:   General appearance - well appearing, and in no distress  Mental status - alert, oriented to person, place, and time  Psych:  normal mood  and affect  Skin - warm and dry, normal color, no suspicious lesions noted  Chest - effort normal, all lung fields clear to auscultation bilaterally  Heart - normal rate and regular rhythm  Neck:  midline  trachea, no thyromegaly or nodules  Breasts - breasts appear normal, no suspicious masses, no skin or nipple changes or  axillary nodes  Abdomen - soft, nontender, nondistended, no masses or organomegaly  Pelvic -  VULVA: normal appearing vulva with no masses, tenderness or lesions   VAGINA: normal appearing vagina with normal color and discharge, no lesions   CERVIX: normal appearing cervix without discharge or lesions, no CMT, +white IUD strings seen  UTERUS: uterus is felt to be normal size, shape, consistency and nontender   ADNEXA: No adnexal masses or tenderness noted. 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 Well woman exam with routine gynecological exam (Primary) Pap, mammo, colonoscopy up to date  IUD for contraception  2. IUD (intrauterine device) in place Suspect copper IUD, however patient counseled that there are varying IUD types internationally and would recommend that she use the IUD for the duration recommended by her provider in [place]  3. Midline cystocele Recommend urogyn evaluation - Ambulatory referral to Urogynecology    No follow-ups on file.  Future Appointments  Date Time Provider Department Center  04/11/2024  1:30 PM Daina Drum, MD [facility]  04/18/2024  9:20 AM Abraham Abo, MD [facility] None    Marci Setter, MD, FACOG Obstetrician & Gynecologist, [facility], [facility]

## 2024-03-22 NOTE — Progress Notes (Signed)
 Pt is in office for routine gyn exam. Pt has IUD in place x 7-8 years - ?would like to remove. Pt unsure what IUD (placed in [place]) she has but was told it is a 12 year IUD. If removed, would like pills for a few months.   Pt states she was told she may need surgery for prolapse - saw Dr Dodie Frees last year - would like to discuss.  Pt complains of daily headaches- little relief with OTC meds.  Pt is up to date on Mammo - 01/2024.

## 2024-03-29 ENCOUNTER — Ambulatory Visit: Admission: EM | Admit: 2024-03-29 | Discharge: 2024-03-29 | Disposition: A | Discharge status: Home / Self Care

## 2024-03-29 ENCOUNTER — Encounter: Payer: Self-pay | Admitting: Emergency Medicine

## 2024-03-29 DIAGNOSIS — K0889 Other specified disorders of teeth and supporting structures: Secondary | ICD-10-CM | POA: Diagnosis not present

## 2024-03-29 MED ORDER — AMOXICILLIN 500 MG PO CAPS: 500.0000 mg | ORAL_CAPSULE | Freq: Three times a day (TID) | ORAL | 0 refills | Status: DC

## 2024-03-29 MED ORDER — HYDROCODONE-ACETAMINOPHEN 5-325 MG PO TABS: 1.0000 | ORAL_TABLET | Freq: Four times a day (QID) | ORAL | 0 refills | Status: AC | PRN

## 2024-03-29 NOTE — ED Triage Notes (Signed)
 Pt reports R-sided upper jaw pain and swelling x4 days. Pt notes she had a crown placed 58yrs ago in [place] and feels the pain is coming from underneath it. Does not have a dentist here and has not been in contact with a dentist regarding these problems.

## 2024-03-29 NOTE — ED Provider Notes (Signed)
 [facility]    CSN: [id number] Arrival date & time: 03/29/24  0910      History   Chief Complaint Chief Complaint  Patient presents with   Dental Pain   Facial Swelling    HPI Marcia Miller is a 48 y.o. female.   Patient complains of dental pain.  Patient reports that the pain is in a tooth that has a crown on the right upper side.  Patient had the crown placed 4 years ago in [place].  She does not have a local dentist.  Patient denies any fever or chills.  She has not had any facial swelling.  The history is provided by the patient. The history is limited by a language barrier. A language interpreter was used.  Dental Pain   Past Medical History:  Diagnosis Date   Asthma    Tuberculosis     Patient Active Problem List   Diagnosis Date Noted   History of TB (tuberculosis) 10/06/2022   Acute bilateral low back pain without sciatica 01/25/2022   GERD (gastroesophageal reflux disease) 01/20/2022    History reviewed. No pertinent surgical history.  OB History     Gravida  4   Para      Term      Preterm      AB      Living         SAB      IAB      Ectopic      Multiple      Live Births               Home Medications    Prior to Admission medications   Medication Sig Start Date End Date Taking? Authorizing Provider  amoxicillin  (AMOXIL ) 500 MG capsule Take 1 capsule (500 mg total) by mouth 3 (three) times daily. 03/29/24  Yes Eren Puebla K, PA-C  Clindamycin-Benzoyl Per, Refr, gel Apply topically at bedtime. 02/23/24  Yes [provider]  HYDROcodone -acetaminophen  (NORCO/VICODIN) 5-325 MG tablet Take 1 tablet by mouth every 6 (six) hours as needed for up to 5 days for moderate pain (pain score 4-6). 03/29/24 04/03/24 Yes Edie Vallandingham K, PA-C  silver sulfADIAZINE (SILVADENE) 1 % cream Apply topically. 01/02/24 06/30/24 Yes [provider]  triamcinolone  (KENALOG ) 0.025 % cream Apply topically. 01/02/24  Yes  [provider]  acetaminophen  (TYLENOL ) 500 MG tablet Take 1 tablet (500 mg total) by mouth every 6 (six) hours as needed. 05/11/23   Senaida Dama, NP  albuterol  (VENTOLIN  HFA) 108 (90 Base) MCG/ACT inhaler Inhale 2 puffs into the lungs every 6 (six) hours as needed for wheezing or shortness of breath. 06/10/23   Antonio Baumgarten, NP  esomeprazole  (NEXIUM ) 40 MG capsule Take 1 capsule (40 mg total) by mouth 2 (two) times daily before a meal. 02/09/24   Daina Drum, MD  fluticasone  (FLONASE ) 50 MCG/ACT nasal spray Place 1 spray into both nostrils daily. 08/11/23   Abraham Abo, MD  fluticasone -salmeterol (ADVAIR DISKUS) 250-50 MCG/ACT AEPB Inhale 1 puff into the lungs in the morning and at bedtime. 08/11/23   Abraham Abo, MD  Iron , Ferrous Sulfate , 325 (65 Fe) MG TABS Take 325 mg by mouth daily. 01/11/24   Abraham Abo, MD  levocetirizine (XYZAL ) 5 MG tablet Take 1 tablet (5 mg total) by mouth every evening. 08/11/23   Abraham Abo, MD  montelukast  (SINGULAIR ) 10 MG tablet Take 1 tablet (10 mg total) by mouth at bedtime. 08/11/23  Abraham Abo, MD  VOQUEZNA  20 MG TABS TAKE 1 TABLET BY MOUTH DAILY( FOR 8 WEEKS THEN TAKE 10 MG EVERY DAY) 03/08/24   Daina Drum, MD    Family History Family History  Problem Relation Age of Onset   Liver disease Mother        ascites   Liver disease Sister        ascites    Social History Social History   Tobacco Use   Smoking status: Never    Passive exposure: Never   Smokeless tobacco: Never  Vaping Use   Vaping status: Never Used  Substance Use Topics   Alcohol use: Never   Drug use: Never     Allergies   Patient has no known allergies.   Review of Systems Review of Systems  All other systems reviewed and are negative.    Physical Exam Triage Vital Signs ED Triage Vitals [03/29/24 0929]  Encounter Vitals Group     BP 110/78     Systolic BP Percentile      Diastolic BP Percentile      Pulse Rate 82     Resp  16     Temp 97.7 F (36.5 C)     Temp Source Oral     SpO2 97 %     Weight      Height      Head Circumference      Peak Flow      Pain Score 10     Pain Loc      Pain Education      Exclude from Growth Chart    No data found.  Updated Vital Signs BP 110/78 (BP Location: Left Arm)   Pulse 82   Temp 97.7 F (36.5 C) (Oral)   Resp 16   LMP 03/17/2024   SpO2 97%   Visual Acuity Right Eye Distance:   Left Eye Distance:   Bilateral Distance:    Right Eye Near:   Left Eye Near:    Bilateral Near:     Physical Exam Vitals and nursing note reviewed.  Constitutional:      Appearance: She is well-developed.  HENT:     Head: Normocephalic.     Mouth/Throat:     Comments: Crown right upper gum, tooth beside crown has a small cavity.  No erythema no edema Cardiovascular:     Rate and Rhythm: Normal rate.  Pulmonary:     Effort: Pulmonary effort is normal.  Abdominal:     General: There is no distension.  Musculoskeletal:        General: Normal range of motion.  Skin:    General: Skin is warm.  Neurological:     General: No focal deficit present.     Mental Status: She is alert and oriented to person, place, and time.      UC Treatments / Results  Labs (all labs ordered are listed, but only abnormal results are displayed) Labs Reviewed - No data to display  EKG   Radiology No results found.  Procedures Procedures (including critical care time)  Medications Ordered in UC Medications - No data to display  Initial Impression / Assessment and Plan / UC Course  I have reviewed the triage vital signs and the nursing notes.  Pertinent labs & imaging results that were available during my care of the patient were reviewed by me and considered in my medical decision making (see chart for details).  Final Clinical Impressions(s) / UC Diagnoses   Final diagnoses:  Toothache   Discharge Instructions      Return if any problems.    See Below for  Dentists that accept your insurance (please call to ensure they are still in network with yours):                             ED Prescriptions     Medication Sig Dispense Auth. Provider   amoxicillin  (AMOXIL ) 500 MG capsule Take 1 capsule (500 mg total) by mouth 3 (three) times daily. 30 capsule Ezri Fanguy K, PA-C   HYDROcodone -acetaminophen  (NORCO/VICODIN) 5-325 MG tablet Take 1 tablet by mouth every 6 (six) hours as needed for up to 5 days for moderate pain (pain score 4-6). 12 tablet Dortha Neighbors K, PA-C      I have reviewed the PDMP during this encounter. Patient advised to follow-up with dentist for further evaluation.  Patient is given dental resources An After Visit Summary was printed and given to the patient.       Sandi Crosby, PA-C 03/29/24 1249

## 2024-03-29 NOTE — Discharge Instructions (Addendum)
 Return if any problems.    See Below for Dentists that accept your insurance (please call to ensure they are still in network with yours):

## 2024-04-11 ENCOUNTER — Ambulatory Visit (INDEPENDENT_AMBULATORY_CARE_PROVIDER_SITE_OTHER): Admitting: Internal Medicine

## 2024-04-11 ENCOUNTER — Encounter: Payer: Self-pay | Admitting: Internal Medicine

## 2024-04-11 VITALS — BP 104/76 | HR 75 | Ht 68.0 in | Wt 148.8 lb

## 2024-04-11 DIAGNOSIS — K5900 Constipation, unspecified: Secondary | ICD-10-CM | POA: Diagnosis not present

## 2024-04-11 DIAGNOSIS — E611 Iron deficiency: Secondary | ICD-10-CM | POA: Diagnosis not present

## 2024-04-11 DIAGNOSIS — K219 Gastro-esophageal reflux disease without esophagitis: Secondary | ICD-10-CM

## 2024-04-11 DIAGNOSIS — Z1211 Encounter for screening for malignant neoplasm of colon: Secondary | ICD-10-CM

## 2024-04-11 MED ORDER — ESOMEPRAZOLE MAGNESIUM 40 MG PO CPDR: 40.0000 mg | DELAYED_RELEASE_CAPSULE | Freq: Two times a day (BID) | ORAL | 2 refills | Status: DC

## 2024-04-11 NOTE — Progress Notes (Signed)
 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   Interval History: Arabic interpreter present for today's visit. Patient's husband was also present as well. She is doing well. She went to her pharmacy to pick up her Nexium , but she was told that her insurance does not cover that medication so she was not able to pick it up at all.  Voquezna  made her sleepy and did not help with her acid reflux. She only took one dose of the Voquezna . Denies cough, which improved after taking levocetirizine. Denies chest burning. When she eats chocolate, this will cause acid reflux. Denies abdominal pain. Sometimes she is constipated. She on average has one BM every 1-2 days. She is eating and drinking well. Patient is on an iron  supplement. She has headaches and is going to get a brain MRI that was ordered by her PCP. She still has menstrual periods.     Latest Ref Rng & Units 08/10/2023    9:50 AM 06/30/2022    9:50 AM 01/22/2022   12:30 PM  CBC  WBC 4.0 - 10.5 K/uL 5.2  5.8  6.7   Hemoglobin 12.0 - 15.0 g/dL 40.9  81.1  91.4   Hematocrit 36.0 - 46.0 % 40.2  40.2  42.2   Platelets 150.0 - 400.0 K/uL 219.0  211  220        Latest Ref Rng & Units 08/10/2023    9:50 AM 06/30/2022    9:50 AM 01/22/2022   12:30 PM  CMP  Glucose 70 - 99 mg/dL 91  93  782   BUN 6 - 23 mg/dL 8  8  <5   Creatinine 9.56 - 1.20 mg/dL 2.13  0.86  5.78   Sodium 135 - 145 mEq/L 138  137  138   Potassium 3.5 - 5.1 mEq/L 4.0  4.6  3.8   Chloride 96 - 112 mEq/L 106  103  107   CO2 19 - 32 mEq/L 24  22  23    Calcium 8.4 - 10.5 mg/dL 9.3  9.6  46.9   Total Protein 6.0 - 8.3 g/dL 6.6  6.8  7.1   Total Bilirubin 0.2 - 1.2 mg/dL 1.1  0.8   1.7   Alkaline Phos 39 - 117 U/L 43  67  56   AST 0 - 37 U/L 10  10  17    ALT 0 - 35 U/L 5  6  13      Social History: She is married.  She has 3 sons and 1 daughter.  Non-smoker.  No alcohol use.  No drug use.  Family History:  No known family history or esophageal, gastric or colon cancer. Mother had stomach problems, no cancer.   No Known Allergies    Outpatient Encounter Medications as of 04/11/2024  Medication Sig   acetaminophen  (TYLENOL ) 500 MG tablet Take 1 tablet (500 mg total) by mouth every 6 (six) hours as needed.   albuterol  (VENTOLIN  HFA) 108 (90 Base) MCG/ACT inhaler Inhale 2 puffs into the lungs every 6 (six) hours as needed for wheezing or shortness of breath.   fluticasone  (FLONASE ) 50 MCG/ACT nasal spray Place 1 spray into both nostrils daily.   fluticasone -salmeterol (ADVAIR  DISKUS) 250-50 MCG/ACT AEPB Inhale 1 puff into the lungs in the morning and at bedtime.   Iron , Ferrous Sulfate , 325 (65 Fe) MG TABS Take 325 mg by mouth daily.   levocetirizine (XYZAL ) 5 MG tablet Take 1 tablet (5 mg total) by mouth every evening.   montelukast  (SINGULAIR ) 10 MG tablet Take 1 tablet (10 mg total) by mouth at bedtime.   silver sulfADIAZINE (SILVADENE) 1 % cream Apply topically.   triamcinolone  (KENALOG ) 0.025 % cream Apply topically.   VOQUEZNA  20 MG TABS TAKE 1 TABLET BY MOUTH DAILY( FOR 8 WEEKS THEN TAKE 10 MG EVERY DAY)   [DISCONTINUED] esomeprazole  (NEXIUM ) 40 MG capsule Take 1 capsule (40 mg total) by mouth 2 (two) times daily before a meal.   esomeprazole  (NEXIUM ) 40 MG capsule Take 1 capsule (40 mg total) by mouth 2 (two) times daily before a meal.   [DISCONTINUED] amoxicillin  (AMOXIL ) 500 MG capsule Take 1 capsule (500 mg total) by mouth 3 (three) times daily.   [DISCONTINUED] Clindamycin-Benzoyl Per, Refr, gel Apply topically at bedtime.   No facility-administered encounter medications on file as of 04/11/2024.   PHYSICAL EXAM: BP 104/76   Pulse 75   Ht 5' 8 (1.727 m)    Wt 148 lb 12.8 oz (67.5 kg)   LMP 03/17/2024   BMI 22.62 kg/m   General: 48 year old female in no acute distress. Head: Normocephalic and atraumatic. Eyes:  Sclerae non-icteric, conjunctive pink. Ears: Normal auditory acuity. Mouth: Dentition intact. No ulcers or lesions. No oral thrush.  Neck: Supple, no lymphadenopathy or thyromegaly.  Lungs: Clear bilaterally to auscultation without wheezes, crackles or rhonchi. Heart: Regular rate and rhythm. No murmur, rub or gallop appreciated.  Abdomen: Soft, nondistended. Tender in the RUQ, epigastric ab pain, and LLQ. No masses. No hepatosplenomegaly. Normoactive bowel sounds x 4 quadrants.  Rectal: Deferred.  Musculoskeletal: Symmetrical with no gross deformities. Skin: Warm and dry. No rash or lesions on visible extremities. Extremities: No edema. Neurological: Alert oriented x 4, no focal deficits.  Psychological:  Alert and cooperative. Normal mood and affect.  Labs 08/2023: CBC and CMP nml.   Labs 12/2023: IgA nml. TTG igA and echinococcus antibody not collected (unclear why).  RUQ U/S 12/20/23: IMPRESSION: No cholelithiasis or sonographic evidence for acute cholecystitis.  EGD 08/30/23: - Normal esophagus. Biopsied. - Erythematous mucosa in the antrum. Biopsied. - Normal examined duodenum. Path: 1. Surgical [P], gastric antrum and gastric body :       GASTRIC ANTRAL/OXYNTIC MUCOSA WITH NO SIGNIFICANT DIAGNOSTIC ALTERATION.       NO H.PYLORI IDENTIFIED ON H&E STAIN.       NEGATIVE FOR INTESTINAL METAPLASIA OR DYSPLASIA.       2. Surgical [P], esophagus :       ESOPHAGEAL SQUAMOUS MUCOSA WITH NO SIGNIFICANT DIAGNOSTIC ALTERATION.       NO EVIDENCE OF INTRAEPITHELIAL EOSINOPHILS OR LYMPHOCYTES.   Colonoscopy 02/09/24: - A few erosions in the terminal ileum. Biopsied. - Non- bleeding internal hemorrhoids. Path: 1. Surgical [P], small bowel, ileum :       1.  ILEUM, BIOPSY:       -  FOCAL MILD ACTIVE ILEITIS WITH MILD GLANDULAR  DISTORTION.       -  NEGATIVE FOR PSEUDO-PYLORIC METAPLASIA, GRANULOMAS AND DYSPLASIA.       -  NEGATIVE FOR MALIGNANCY.   ASSESSMENT AND PLAN:  GERD Constipation Ion deficiency Overall patient is doing fairly well. Her cough improved after being treated with allergy  medications. She does still have constipation on occasion so I recommended some conservative measures to help with constipation. I gave her a list of iron  rich foods since her iron  supplement seems to cause her to be more constipated. She had no benefit from Voquezna  or Protonix  so will try Nexium  for her acid reflux.  - Previously gave GERD diet handout - Drinks 8 cups of water, walk 30 min per day, high fiber diet - Miralax PRN - Will give a list of iron  rich foods - Will send Nexium  40 mg BID again to see if it gets approved.  - Previously declined EGD with BRAVO - Encouraged her to go back to see her pulmonologist for follow up of SOB - Next colonoscopy for colon cancer screening due in 01/2034 - Return to GI clinic in 4 months  Today's encounter was 30 minutes which included precharting, chart/result review, history/exam, face-to-face time used for counseling, formulating a treatment plan with follow-up and documentation required time for translation as patient speaks Arabic.

## 2024-04-11 NOTE — Patient Instructions (Addendum)
????   8 ????? ?? ????? ?????? ????? ???? 30 ????? ??????.  ???? ???? ??????? ??????? ??? ???? ???? ???????? ??? ?????????:  ?????? ??????? ??? ????????? - ???????? ?????? ??? ?????????.  ????????: ??????? ??? ????????? ??? 3 ???? ?????? ??? ?????? ?????? ???? ????? ??????. aishrab 8 'akwab min alma' ywmyan wamsh limudat 30 daqiqat ywmyan. yurjaa shira' al'adwiat altaaliat dun wasfat tibiyat watanawuliha hasab altawjihati: mukamilat al'alyaf mithl binifaybir - astakhdimaha ywmyan hasab altawjihati. miralaksi: tanawalaha hasab altawjihat hataa 3 maraat ywmyan hasab alhajat litahqiq harakat 'amea' muntazimatin.  ??? ?????? ??????? ??????? ??? ???????? ?????? ?? ?????? ?? ?????? ????? ?? ?? ??? ??????: ??????? laqad 'arsalna al'adwiat altaaliat 'iilaa alsaydaliat alkhasat bik litatamakan min alhusul ealayha fi 'ayi waqt yunasibuku: niksium  ?? ?????? ????? ??? ?????? ?????????? ?????? ?? 20/6/25 ?????? 11 ??????? ???? ?????? ??? 15 ????? min almuqarar 'iijra' fahs alranin almighnatisii lildimagh fi 20/6/25 alsaaeat 11 sbahan, wayajib alwusul qabl 15 daqiqa  Follow up in 4 months _______________________________________________________  If your blood pressure at your visit was 140/90 or greater, please contact your primary care physician to follow up on this.  _______________________________________________________  If you are age 42 or older, your body mass index should be between 23-30. Your Body mass index is 22.62 kg/m. If this is out of the aforementioned range listed, please consider follow up with your Primary Care Provider.  If you are age 66 or younger, your body mass index should be between 19-25. Your Body mass index is 22.62 kg/m. If this is out of the aformentioned range listed, please consider follow up with your Primary Care Provider.   ________________________________________________________  The [facility] providers would like to encourage you to use MYCHART to  communicate with providers for non-urgent requests or questions.  Due to long hold times on the telephone, sending your provider a message by [facility] may be a faster and more efficient way to get a response.  Please allow 48 business hours for a response.  Please remember that this is for non-urgent requests.  _______________________________________________________  Due to recent changes in healthcare laws, you may see the results of your imaging and laboratory studies on MyChart before your provider has had a chance to review them.  We understand that in some cases there may be results that are confusing or concerning to you. Not all laboratory results come back in the same time frame and the provider may be waiting for multiple results in order to interpret others.  Please give us  48 hours in order for your provider to thoroughly review all the results before contacting the office for clarification of your results.   Thank you for entrusting me with your care and for choosing [facility], Dr. Regino Caprio

## 2024-04-18 ENCOUNTER — Ambulatory Visit: Admitting: Family Medicine

## 2024-04-18 ENCOUNTER — Encounter: Payer: Self-pay | Admitting: Family Medicine

## 2024-04-18 ENCOUNTER — Ambulatory Visit (INDEPENDENT_AMBULATORY_CARE_PROVIDER_SITE_OTHER)

## 2024-04-18 VITALS — BP 107/73 | HR 65 | Wt 151.2 lb

## 2024-04-18 DIAGNOSIS — R0602 Shortness of breath: Secondary | ICD-10-CM

## 2024-04-18 DIAGNOSIS — R21 Rash and other nonspecific skin eruption: Secondary | ICD-10-CM | POA: Diagnosis not present

## 2024-04-18 DIAGNOSIS — Z789 Other specified health status: Secondary | ICD-10-CM

## 2024-04-18 DIAGNOSIS — E611 Iron deficiency: Secondary | ICD-10-CM | POA: Diagnosis not present

## 2024-04-18 DIAGNOSIS — R519 Headache, unspecified: Secondary | ICD-10-CM

## 2024-04-18 MED ORDER — TRIAMCINOLONE ACETONIDE 0.025 % EX CREA: TOPICAL_CREAM | Freq: Two times a day (BID) | CUTANEOUS | 0 refills | Status: AC

## 2024-04-18 MED ORDER — PREDNISONE 5 MG (21) PO TBPK: ORAL_TABLET | ORAL | 0 refills | Status: DC

## 2024-04-19 ENCOUNTER — Telehealth: Payer: Self-pay | Admitting: Family Medicine

## 2024-04-19 ENCOUNTER — Encounter: Payer: Self-pay | Admitting: Family Medicine

## 2024-04-19 ENCOUNTER — Ambulatory Visit: Payer: Self-pay | Admitting: Family Medicine

## 2024-04-19 LAB — ANEMIA PANEL
Hematocrit: 39.7 % (ref 34.0–46.6)
Retic Ct Pct: 1 % (ref 0.6–2.6)

## 2024-04-19 NOTE — Telephone Encounter (Signed)
 Copied from CRM [id number]. Topic: Referral - Status >> Apr 19, 2024  4:00 PM Alica Antu wrote: Reason for CRM:  [employer] pre service is requesting an authorization for MRI of the brain to be submitted as patient is scheduled for 04/23/24.  CB [phone] Dawn ext [phone]   May you please assist.

## 2024-04-19 NOTE — Progress Notes (Signed)
 Established Patient Office Visit  Subjective    Patient ID: Marcia Miller, female    DOB: 1975/12/14  Age: 48 y.o. MRN: [id number]  CC:  Chief Complaint  Patient presents with   Medical Management of Chronic Issues    Patient has medication present   Needs blood work    Migraine   Eye Pain   Rash    All over body, itching no pain and area is very red     HPI Marcia Miller presents with complaint of rash. She denies known contacts or exposures but her spouse has also developed the same sx. Denies fever/chills or viral sx.   Outpatient Encounter Medications as of 04/18/2024  Medication Sig   acetaminophen  (TYLENOL ) 500 MG tablet Take 1 tablet (500 mg total) by mouth every 6 (six) hours as needed.   albuterol  (VENTOLIN  HFA) 108 (90 Base) MCG/ACT inhaler Inhale 2 puffs into the lungs every 6 (six) hours as needed for wheezing or shortness of breath.   esomeprazole  (NEXIUM ) 40 MG capsule Take 1 capsule (40 mg total) by mouth 2 (two) times daily before a meal.   fluticasone  (FLONASE ) 50 MCG/ACT nasal spray Place 1 spray into both nostrils daily.   fluticasone -salmeterol (ADVAIR DISKUS) 250-50 MCG/ACT AEPB Inhale 1 puff into the lungs in the morning and at bedtime.   Iron , Ferrous Sulfate , 325 (65 Fe) MG TABS Take 325 mg by mouth daily.   levocetirizine (XYZAL ) 5 MG tablet Take 1 tablet (5 mg total) by mouth every evening.   montelukast  (SINGULAIR ) 10 MG tablet Take 1 tablet (10 mg total) by mouth at bedtime.   predniSONE  (STERAPRED UNI-PAK 21 TAB) 5 MG (21) TBPK tablet Take po daily as recommended   silver sulfADIAZINE (SILVADENE) 1 % cream Apply topically.   VOQUEZNA  20 MG TABS TAKE 1 TABLET BY MOUTH DAILY( FOR 8 WEEKS THEN TAKE 10 MG EVERY DAY)   [DISCONTINUED] triamcinolone  (KENALOG ) 0.025 % cream Apply topically.   triamcinolone  (KENALOG ) 0.025 % cream Apply topically 2 (two) times daily.   No facility-administered encounter medications on file as of  04/18/2024.    Past Medical History:  Diagnosis Date   Asthma    Tuberculosis     History reviewed. No pertinent surgical history.  Family History  Problem Relation Age of Onset   Liver disease Mother        ascites   Liver disease Sister        ascites    Social History   Socioeconomic History   Marital status: Married    Spouse name: Not on file   Number of children: Not on file   Years of education: Not on file   Highest education level: Not on file  Occupational History   Not on file  Tobacco Use   Smoking status: Never    Passive exposure: Never   Smokeless tobacco: Never  Vaping Use   Vaping status: Never Used  Substance and Sexual Activity   Alcohol use: Never   Drug use: Never   Sexual activity: Yes    Partners: Male    Birth control/protection: I.U.D.  Other Topics Concern   Not on file  Social History Narrative   Not on file   Social Drivers of Health   Financial Resource Strain: Low Risk  (08/11/2023)   Overall Financial Resource Strain (CARDIA)    Difficulty of Paying Living Expenses: Not hard at all  Food Insecurity: No Food Insecurity (08/11/2023)   Hunger Vital  Sign    Worried About Programme researcher, broadcasting/film/video in the Last Year: Never true    Ran Out of Food in the Last Year: Never true  Transportation Needs: No Transportation Needs (08/11/2023)   PRAPARE - Administrator, Civil Service (Medical): No    Lack of Transportation (Non-Medical): No  Physical Activity: Inactive (08/11/2023)   Exercise Vital Sign    Days of Exercise per Week: 0 days    Minutes of Exercise per Session: 0 min  Stress: No Stress Concern Present (08/11/2023)   [employer] of Occupational Health - Occupational Stress Questionnaire    Feeling of Stress : Not at all  Social Connections: Not on file  Intimate Partner Violence: Not At Risk (08/11/2023)   Humiliation, Afraid, Rape, and Kick questionnaire    Fear of Current or Ex-Partner: No    Emotionally  Abused: No    Physically Abused: No    Sexually Abused: No    Review of Systems  All other systems reviewed and are negative.       Objective    BP 107/73 (BP Location: Right Arm, Patient Position: Sitting)   Pulse 65   Wt 151 lb 3.2 oz (68.6 kg)   LMP 03/17/2024   SpO2 97%   BMI 22.99 kg/m   Physical Exam Vitals and nursing note reviewed.  Constitutional:      General: She is not in acute distress.  Cardiovascular:     Rate and Rhythm: Normal rate and regular rhythm.  Pulmonary:     Effort: Pulmonary effort is normal.     Breath sounds: Normal breath sounds.  Abdominal:     Palpations: Abdomen is soft.     Tenderness: There is no abdominal tenderness.   Skin:    Findings: Rash present.   Neurological:     General: No focal deficit present.     Mental Status: She is alert and oriented to person, place, and time.         Assessment & Plan:   Rash and nonspecific skin eruption  SOB (shortness of breath) -     DG Chest 2 View; Future  Nonintractable headache, unspecified chronicity pattern, unspecified headache type  Low iron  -     Anemia panel  Language barrier to communication  Other orders -     predniSONE ; Take po daily as recommended  Dispense: 21 tablet; Refill: 0 -     Triamcinolone  Acetonide; Apply topically 2 (two) times daily.  Dispense: 30 g; Refill: 0     No follow-ups on file.   Arlo Lama, MD

## 2024-04-20 ENCOUNTER — Other Ambulatory Visit (INDEPENDENT_AMBULATORY_CARE_PROVIDER_SITE_OTHER)

## 2024-04-20 ENCOUNTER — Encounter: Payer: Self-pay | Admitting: *Deleted

## 2024-04-20 ENCOUNTER — Ambulatory Visit (HOSPITAL_COMMUNITY)

## 2024-04-20 DIAGNOSIS — E611 Iron deficiency: Secondary | ICD-10-CM

## 2024-04-20 NOTE — Progress Notes (Signed)
Patient came in for labs Patient tolerated well

## 2024-04-21 NOTE — Telephone Encounter (Signed)
 PA complete

## 2024-04-23 ENCOUNTER — Other Ambulatory Visit: Payer: Self-pay | Admitting: Family Medicine

## 2024-04-23 ENCOUNTER — Ambulatory Visit (HOSPITAL_COMMUNITY)

## 2024-04-23 ENCOUNTER — Telehealth: Payer: Self-pay | Admitting: *Deleted

## 2024-04-23 DIAGNOSIS — R519 Headache, unspecified: Secondary | ICD-10-CM

## 2024-04-23 NOTE — Telephone Encounter (Signed)
 I received notification that patient MRI was denied due to needed more information regarding patient headache.   Provider notified about decision and the request from patient insurance company. Provider opt out to due an addendum, but to resend a referral to Neurology for consult.

## 2024-04-24 LAB — ANEMIA PANEL: Folate, Hemolysate: 290 ng/mL

## 2024-05-21 ENCOUNTER — Ambulatory Visit: Payer: Self-pay

## 2024-05-21 ENCOUNTER — Encounter: Payer: Self-pay | Admitting: Neurology

## 2024-05-21 NOTE — Congregational Nurse Program (Signed)
 Patient came in with a letter from insurance denying the MRI ordered by PCP. Indication was headache. Patient describes headaches as getting worse since last PCP appointment, 10/10 pain, involving neck and upper shoulders. Has experienced headaches for 4-5 years but relieved by tylenol  until recently. Unable to get relief with max dose of tylenol  at this time. Current headache has lasted for several days and prevents sleep, hurts behind eyes. States she is dizzy. Denies nausea, light or sound sensitivity, numbness or tingling. Called PCP and spoke to triage nurse through NAI interpreter. Triage nurse advised patient to go to urgent care directly. She says she will go to urgent care at [facility] tomorrow morning at 8 am when spouse is available, unable to go today. Urged her to go to ED if symptoms worsen between now and then. Communication facilitated by [facility] and [employer] interpreters.

## 2024-05-21 NOTE — Telephone Encounter (Signed)
 FYI Only or Action Required?: Action required by provider: referral request, clinical question for provider, and lab or test result follow-up needed.  Patient was last seen in primary care on 04/18/2024 by Tanda Bleacher, MD.  Called Nurse Triage reporting No chief complaint on file..  Symptoms began several years ago.  Interventions attempted: OTC medications: Tylenol , Ibuprofen .  Symptoms are: gradually worsening.  Triage Disposition: No disposition on file.  Patient/caregiver understands and will follow disposition?:    Copied from CRM [id number]. Topic: Clinical - Red Word Triage >> May 21, 2024 11:24 AM Selinda RAMAN wrote: Red Word that prompted transfer to Nurse Triage: [facility] Nurse called in stating the patient had an MRI of the brain denied by her insurance. 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 Kreg has asked for help and I will transfer her to E2C2 NT with her worsening condition. Answer Assessment - Initial Assessment Questions 1. LOCATION: Where does it hurt?      Neck all the way to the top of her head, and behind her eyes as well.   2. ONSET: When did the headache start? (e.g., minutes, hours, days)      Chronic  3. PATTERN: Does the pain come and go, or has it been constant since it started?     Intermittent, Recently un relievable  4. SEVERITY: How bad is the pain? and What does it keep you from doing?  (e.g., Scale 1-10; mild, moderate, or severe)     Severe   5. RECURRENT SYMPTOM: Have you ever had headaches before? If Yes, ask: When was the last time? and What happened that time?      Yes, usually resolved with over the counter medications  6. CAUSE: What do you think is causing the headache?     Unsure  7. MIGRAINE: Have you been diagnosed with migraine headaches? If Yes,  ask: Is this headache similar?      No, never diagnosed  8. HEAD INJURY: Has there been any recent injury to your head?      No  9. OTHER SYMPTOMS: Do you have any other symptoms? (e.g., fever, stiff neck, eye pain, sore throat, cold symptoms)     Eye Pain, Dizziness (Spinning)  10. PREGNANCY: Is there any chance you are pregnant? When was your last menstrual period?       No, No  Spoke with Kreg who is a Engineer, water. Called in regarding a recent inquiry and or claim for a MRI being denied by insurance as the patient is having worsening unrelenting headaches.   Unsure if the insurance denied the claim due to it being a coding issue as she noticed the code in the chart for the type of headache. Case being passed along to provider for further review and resolution.   Telephonic Triage and Assessment completed with the patient on the phone and communication facilitated between myself and Kreg with the assistance of Interpretor Nour Shan Shaker at [facility].  Protocols used: [facility]

## 2024-05-21 NOTE — Telephone Encounter (Signed)
 Patient called and advise that the neurologist has been trying to reach her with referral for headaches. Patient was given  the number to give them  a call and see if she can schedule  an appointment.  Aided by [id number]

## 2024-07-09 ENCOUNTER — Encounter: Payer: Self-pay | Admitting: Obstetrics and Gynecology

## 2024-07-09 ENCOUNTER — Ambulatory Visit (INDEPENDENT_AMBULATORY_CARE_PROVIDER_SITE_OTHER): Admitting: Obstetrics and Gynecology

## 2024-07-09 VITALS — BP 107/73 | HR 78 | Ht 64.2 in | Wt 152.6 lb

## 2024-07-09 DIAGNOSIS — N814 Uterovaginal prolapse, unspecified: Secondary | ICD-10-CM | POA: Diagnosis not present

## 2024-07-09 DIAGNOSIS — R399 Unspecified symptoms and signs involving the genitourinary system: Secondary | ICD-10-CM | POA: Diagnosis not present

## 2024-07-09 DIAGNOSIS — N8110 Cystocele, unspecified: Secondary | ICD-10-CM

## 2024-07-09 DIAGNOSIS — N393 Stress incontinence (female) (male): Secondary | ICD-10-CM | POA: Diagnosis not present

## 2024-07-09 DIAGNOSIS — Z975 Presence of (intrauterine) contraceptive device: Secondary | ICD-10-CM

## 2024-07-09 DIAGNOSIS — M6289 Other specified disorders of muscle: Secondary | ICD-10-CM

## 2024-07-09 DIAGNOSIS — N816 Rectocele: Secondary | ICD-10-CM

## 2024-07-09 LAB — POCT URINALYSIS DIP (CLINITEK)
Bilirubin, UA: NEGATIVE
Blood, UA: NEGATIVE
Glucose, UA: NEGATIVE mg/dL
Ketones, POC UA: NEGATIVE mg/dL
Leukocytes, UA: NEGATIVE
Nitrite, UA: NEGATIVE
POC PROTEIN,UA: NEGATIVE
Spec Grav, UA: 1.015 (ref 1.010–1.025)
Urobilinogen, UA: 0.2 U/dL
pH, UA: 7 (ref 5.0–8.0)

## 2024-07-09 MED ORDER — PHENAZOPYRIDINE HCL 200 MG PO TABS: 200.0000 mg | ORAL_TABLET | Freq: Three times a day (TID) | ORAL | 0 refills | Status: AC | PRN

## 2024-07-09 NOTE — Patient Instructions (Signed)
Consider surgical options

## 2024-07-09 NOTE — Addendum Note (Signed)
 Addended by: Suha Schoenbeck N on: 07/09/2024 03:03 PM   Modules accepted: Orders

## 2024-07-09 NOTE — Progress Notes (Signed)
 [facility] New Patient Evaluation and Consultation  Referring Provider: Abigail Rollo DASEN, MD PCP: Tanda Bleacher, MD Date of Service: 07/09/2024   Due to language barrier, an interpreter was present during the history-taking and subsequent discussion (and for part of the physical exam) with this patient.   SUBJECTIVE Chief Complaint: New Patient (Initial Visit) (Marcia Miller is a 48 y.o. female is here for Midline cystocele.)  History of Present Illness: Marcia Miller is a 48 y.o. arabic female seen in consultation at the request of Dr. Abigail for evaluation of prolaspe. Reports she has done pelvic floor PT and treatment in [place]. She wants surgical intervention.    Review of records significant for: Per Dr. Bolivar note on 03/22/24 Pelvic -  VULVA: normal appearing vulva with no masses, tenderness or lesions       VAGINA: normal appearing vagina with normal color and discharge, no lesions         CERVIX: normal appearing cervix without discharge or lesions, no CMT, +white IUD strings seen             UTERUS: uterus is felt to be normal size, shape, consistency and nontender              ADNEXA: No adnexal masses or tenderness noted. Prolapse of anterior wall of vagina to introitus with valsalva  Urinary Symptoms: Leaks urine with cough/ sneeze, lifting, going from sitting to standing, with a full bladder, with movement to the bathroom, with urgency, and without sensation Leaks 1-2 time(s) per days.  Pad use: 2 liners/ mini-pads per day.   Patient is bothered by UI symptoms.  Day time voids 6-10?SABRA  Nocturia: 2-3 times per night to void. Voiding dysfunction:  does not empty bladder well.  Patient does not use a catheter to empty bladder.  When urinating, patient feels a weak stream, difficulty starting urine stream, dribbling after finishing, the need to urinate multiple times in a row, and to push on her belly or vagina to empty bladder Drinks:  Water per day  UTIs: multiple UTI's in the last year.  Last positive culture recorded 11/08/22 Denies history of blood in urine, kidney or bladder stones, pyelonephritis, bladder cancer, and kidney cancer No results found for the last 90 days.   Pelvic Organ Prolapse Symptoms:                  Patient Admits to a feeling of a bulge the vaginal area. It has been present for 21 years.  Patient Admits to seeing a bulge.  This bulge is bothersome.  Bowel Symptom: Bowel movements: 2-3 time(s) per day Stool consistency: solid or soft  Straining: yes.  Splinting: yes.  Incomplete evacuation: yes.  Patient Denies accidental bowel leakage / fecal incontinence Bowel regimen: none Last colonoscopy: Date 02/09/24, Results non-bleeding internal hemorrhoids.  HM Colonoscopy          Upcoming     Colonoscopy (Every 10 Years) Next due on 02/08/2034    02/09/2024  COLONOSCOPY   Only the first 1 history entries have been loaded, but more history exists.                Sexual Function Sexually active: yes.  Sexual orientation: Straight Pain with sex: Yes, at the vaginal opening, has discomfort due to prolapse  Pelvic Pain Admits to pelvic pain Location: Middle of pelvic floor Pain occurs: After intercourse Prior pain treatment: None Improved by: nothing Worsened by: intercourse  Past Medical History:  Past Medical History:  Diagnosis Date   Asthma    Tuberculosis      Past Surgical History:  History reviewed. No pertinent surgical history.   Past OB/GYN History: [id number] Vaginal deliveries: 4,  Forceps/ Vacuum deliveries: 0, Cesarean section: 0 Menopausal: No Contraception: IUD. Last pap smear was 2025.  Any history of abnormal pap smears: no. HM PAP   This patient has no relevant Health Maintenance data.     Medications: Patient has a current medication list which includes the following prescription(s): phenazopyridine , acetaminophen , albuterol , esomeprazole ,  fluticasone , fluticasone -salmeterol, iron  (ferrous sulfate ), levocetirizine, montelukast , triamcinolone , and voquezna .   Allergies: Patient has no known allergies.   Social History:  Social History   Tobacco Use   Smoking status: Never    Passive exposure: Never   Smokeless tobacco: Never  Vaping Use   Vaping status: Never Used  Substance Use Topics   Alcohol use: Never   Drug use: Never    Relationship status: married Patient lives with husband and children.   Patient is not employed. Regular exercise: No History of abuse: No  Family History:   Family History  Problem Relation Age of Onset   Liver disease Mother        ascites   Stroke Father    Liver disease Sister        ascites     Review of Systems: Review of Systems  Constitutional:  Positive for malaise/fatigue. Negative for chills and fever.       +weight gain  Respiratory:  Positive for cough, shortness of breath and wheezing.   Cardiovascular:  Positive for palpitations and leg swelling. Negative for chest pain.  Gastrointestinal:  Positive for abdominal pain. Negative for blood in stool, constipation and diarrhea.  Genitourinary:  Positive for dysuria.       +Vaginal d/c +Abnormal periods  Skin:  Negative for rash.  Neurological:  Positive for dizziness and headaches. Negative for weakness.  Endo/Heme/Allergies:  Bruises/bleeds easily.       +Hot Flashes  Psychiatric/Behavioral:  Negative for depression and suicidal ideas.      OBJECTIVE Physical Exam: Vitals:   07/09/24 1035  BP: 107/73  Pulse: 78  Weight: 152 lb 9.6 oz (69.2 kg)  Height: 5' 4.2 (1.631 m)    Physical Exam Vitals reviewed. Exam conducted with a chaperone present.  Constitutional:      Appearance: Normal appearance.  Pulmonary:     Effort: Pulmonary effort is normal.  Abdominal:     Palpations: Abdomen is soft.  Neurological:     General: No focal deficit present.     Mental Status: She is alert and oriented to  person, place, and time.  Psychiatric:        Mood and Affect: Mood normal.        Behavior: Behavior normal. Behavior is cooperative.        Thought Content: Thought content normal.      GU / Detailed Urogynecologic Evaluation:  Pelvic Exam: Normal external female genitalia; Bartholin's and Skene's glands normal in appearance; urethral meatus normal in appearance, no urethral masses or discharge.   CST: positive  Speculum exam reveals normal vaginal mucosa without atrophy. Cervix normal appearance, presence of blood from cervical os, and IUD string visualized. Uterus normal single, nontender. Adnexa normal adnexa.    With apex supported, anterior compartment defect was present  Pelvic floor strength I/V  Pelvic floor musculature: Right levator non-tender, Right obturator tender, Left levator tender,  Left obturator tender  POP-Q:   POP-Q  1                                            Aa   1                                           Ba  -5                                              C   5                                            Gh  4                                            Pb  9.5                                            tvl   -1                                            Ap  -1                                            Bp  -6                                              D      Rectal Exam:  Normal external exam  Post-Void Residual (PVR) by straight cath     Post Void Residual - 07/09/24 1121       Post Void Residual   Post Void Residual 375 mL           Laboratory Results: Lab Results  Component Value Date   COLORU yellow 10/12/2023   CLARITYU clear 10/12/2023   GLUCOSEUR negative 10/12/2023   BILIRUBINUR negative 10/12/2023   KETONESU TRACE 11/08/2022   SPECGRAV >=1.030 (A) 10/12/2023   RBCUR negative 10/12/2023   PHUR 6.0 10/12/2023   PROTEINUR Negative 11/08/2022   UROBILINOGEN 0.2 10/12/2023   LEUKOCYTESUR Negative  10/12/2023    Lab Results  Component Value Date   CREATININE 0.56 08/10/2023   CREATININE 0.63 06/30/2022   CREATININE 0.63 01/22/2022    Lab Results  Component Value Date   HGBA1C 5.2 03/04/2022    Lab Results  Component Value Date   HGB 13.0 08/10/2023  ASSESSMENT AND PLAN Ms. Pevey is a 48 y.o. with:  1. Prolapse of anterior vaginal wall   2. Uterine prolapse   3. Posterior vaginal wall prolapse   4. Pelvic floor dysfunction in female   5. SUI (stress urinary incontinence, female)   6. Lower urinary tract symptoms (LUTS)   7. Presence of IUD    Patient has stage II/IV anterior, I/IV apical, and I/IV posterior vaginal prolapse. She does not desire to try a pessary and reports she has done pelvic floor PT in [place] and wants surgical intervention.  We discussed sacrocolpopexy as well as sacrospinous hysteropexy. Patient desires to retain her uterus. She does not want a hysterectomy. We discussed that there is a 20-30% failure of this surgery and she reports understanding. Handouts in arabic given to patient on both surgical options to discuss with her husband who was also in attendance for this visit.  Patient may also need a perineorrhaphy or posterior repair. Patient has reports of feeling the bulge more with bowel movements and it is possible her posterior wall prolapses more with bowel movements due to the reports of splinting. This will be further evaluated by the surgeon.  Patient has significant pelvic floor pain. We discussed that doing surgery may not fix this pain. She also may not be the best candidate for mesh procedures based on her pelvic floor dysfunction. Declines PT as she reports she has already done this and reiterated she wants surgical intervention.  +CST on exam. We discussed options of urethral bulking vs. Surgical sling. Arabic handouts given on both options.  Patient may have underlying overactive bladder, but due to the long surgical discussions  and language barrier it was not feasible to delve deeper into potential overactive bladder issues. She does say she has infections every few months with negative cultures in chart. She may have some component of bladder irritation syndrome. Patient had a catheterized sample done today as she was on her menses.  Pyridium  given as she reported significant irritation following catheterization.  Patient reports she wants her IUD removed. She states she would like this removed and then a new one placed during surgery. I defer to the surgeon as this is not something we routinely do.   Patient to follow up for surgical planning with Surgeon.    Yoshiharu Brassell G Journe Hallmark, NP

## 2024-07-11 ENCOUNTER — Other Ambulatory Visit: Payer: Self-pay | Admitting: Pulmonary Disease

## 2024-07-11 DIAGNOSIS — J4520 Mild intermittent asthma, uncomplicated: Secondary | ICD-10-CM

## 2024-07-19 ENCOUNTER — Telehealth: Payer: Self-pay

## 2024-07-19 NOTE — Telephone Encounter (Signed)
 Tried to reach patient w/ interpreter VM / no way to leave message. Patient is over due for a follow up appointment so we can refill Rx

## 2024-07-30 ENCOUNTER — Ambulatory Visit: Payer: Self-pay | Admitting: Pulmonary Disease

## 2024-07-30 NOTE — Telephone Encounter (Signed)
 FYI Only or Action Required?: FYI only for provider.  Patient was last seen in primary care on 04/18/2024 by Tanda Bleacher, MD.  Called Nurse Triage reporting Shortness of Breath.  Symptoms began several days ago.  Interventions attempted: Nothing.  Symptoms are: gradually worsening.  Triage Disposition: Go to ED Now (Notify PCP)  Patient/caregiver understands and will follow disposition?: No, refuses disposition   Copied from CRM [id number]. Topic: Clinical - Red Word Triage >> Jul 30, 2024  9:41 AM Rilla B wrote: [facility] that prompted transfer to Nurse Triage: Difficulty Breathing, coughing a lot, history of asthma Reason for Disposition  [1] MODERATE difficulty breathing (e.g., speaks in phrases, SOB even at rest, pulse 100-120) AND [2] NEW-onset or WORSE than normal  Answer Assessment - Initial Assessment Questions Speaking to patient through an interpreter at her home. She is saying she's been having SOB that is not better x 2 days, with chest pain x 2 days, cough x 10 days. She says the chest pain has been there for about 2 weeks, but has gotten worse over the past 2 days. She has wheezing at times. Advised ED due to the worsening SOB and CP. She refused stating they will not do anything for me and the last time this happened, my doctor said to come see him in the office and he will take care of me. Placed patient on hold, called [facility] and spoke to Sabinal, advising of the above. She says there is an appointment today with a NP. Patient says she only wants to see her doctor. Alexis scheduled for tomorrow morning with Dr. Kara. Advised if symptoms worsen to go to the ED or call 911, she verbalized understanding.    1. RESPIRATORY STATUS: Describe your breathing? (e.g., wheezing, shortness of breath, unable to speak, severe coughing)      SOB, cough (10 days ago), wheezing sometimes 2. ONSET: When did this breathing problem begin?      2 days ago 3. PATTERN Does the  difficult breathing come and go, or has it been constant since it started?      Constant 4. SEVERITY: How bad is your breathing? (e.g., mild, moderate, severe)      Severe 5. RECURRENT SYMPTOM: Have you had difficulty breathing before? If Yes, ask: When was the last time? and What happened that time?      Yes 9. OTHER SYMPTOMS: Do you have any other symptoms? (e.g., chest pain, cough, dizziness, fever, runny nose)     Chest pain 10. O2 SATURATION MONITOR:  Do you use an oxygen saturation monitor (pulse oximeter) at home? If Yes, ask: What is your reading (oxygen level) today? What is your usual oxygen saturation reading? (e.g., 95%)       No pulse ox at home  Protocols used: Breathing Difficulty-A-AH

## 2024-07-31 ENCOUNTER — Ambulatory Visit: Admitting: Pulmonary Disease

## 2024-07-31 ENCOUNTER — Encounter: Payer: Self-pay | Admitting: Pulmonary Disease

## 2024-07-31 VITALS — BP 111/76 | HR 71 | Ht 64.0 in | Wt 155.0 lb

## 2024-07-31 DIAGNOSIS — J0100 Acute maxillary sinusitis, unspecified: Secondary | ICD-10-CM | POA: Diagnosis not present

## 2024-07-31 DIAGNOSIS — J4531 Mild persistent asthma with (acute) exacerbation: Secondary | ICD-10-CM

## 2024-07-31 DIAGNOSIS — J4520 Mild intermittent asthma, uncomplicated: Secondary | ICD-10-CM

## 2024-07-31 DIAGNOSIS — K219 Gastro-esophageal reflux disease without esophagitis: Secondary | ICD-10-CM | POA: Diagnosis not present

## 2024-07-31 DIAGNOSIS — Z1211 Encounter for screening for malignant neoplasm of colon: Secondary | ICD-10-CM

## 2024-07-31 LAB — CBC WITH DIFFERENTIAL/PLATELET
Basophils Absolute: 0.1 K/uL (ref 0.0–0.1)
Basophils Relative: 1 % (ref 0.0–3.0)
Eosinophils Absolute: 0.1 K/uL (ref 0.0–0.7)
Eosinophils Relative: 1.2 % (ref 0.0–5.0)
HCT: 38.9 % (ref 36.0–46.0)
Hemoglobin: 12.9 g/dL (ref 12.0–15.0)
Lymphocytes Relative: 32.9 % (ref 12.0–46.0)
Lymphs Abs: 1.9 K/uL (ref 0.7–4.0)
MCHC: 33.3 g/dL (ref 30.0–36.0)
MCV: 88.9 fl (ref 78.0–100.0)
Monocytes Absolute: 0.4 K/uL (ref 0.1–1.0)
Monocytes Relative: 6.9 % (ref 3.0–12.0)
Neutro Abs: 3.3 K/uL (ref 1.4–7.7)
Neutrophils Relative %: 58 % (ref 43.0–77.0)
Platelets: 193 K/uL (ref 150.0–400.0)
RBC: 4.37 Mil/uL (ref 3.87–5.11)
RDW: 13.6 % (ref 11.5–15.5)
WBC: 5.7 K/uL (ref 4.0–10.5)

## 2024-07-31 MED ORDER — MONTELUKAST SODIUM 10 MG PO TABS: 10.0000 mg | ORAL_TABLET | Freq: Every day | ORAL | 3 refills | Status: AC

## 2024-07-31 MED ORDER — ESOMEPRAZOLE MAGNESIUM 40 MG PO CPDR: 40.0000 mg | DELAYED_RELEASE_CAPSULE | Freq: Every day | ORAL | 3 refills | Status: AC

## 2024-07-31 MED ORDER — AZITHROMYCIN 250 MG PO TABS: ORAL_TABLET | ORAL | 0 refills | Status: AC

## 2024-07-31 MED ORDER — PREDNISONE 10 MG PO TABS: ORAL_TABLET | ORAL | 0 refills | Status: AC

## 2024-07-31 MED ORDER — LEVOCETIRIZINE DIHYDROCHLORIDE 5 MG PO TABS: 5.0000 mg | ORAL_TABLET | Freq: Every evening | ORAL | 3 refills | Status: AC

## 2024-07-31 MED ORDER — FLUTICASONE-SALMETEROL 250-50 MCG/ACT IN AEPB: 1.0000 | INHALATION_SPRAY | Freq: Two times a day (BID) | RESPIRATORY_TRACT | 11 refills | Status: DC

## 2024-07-31 MED ORDER — ALBUTEROL SULFATE HFA 108 (90 BASE) MCG/ACT IN AERS: 2.0000 | INHALATION_SPRAY | Freq: Four times a day (QID) | RESPIRATORY_TRACT | 11 refills | Status: AC | PRN

## 2024-07-31 MED ORDER — FLUTICASONE PROPIONATE 50 MCG/ACT NA SUSP: 1.0000 | Freq: Every day | NASAL | 11 refills | Status: AC

## 2024-07-31 NOTE — Patient Instructions (Addendum)
 Continue advair 250-50mcg 1 puff twice daily  Use albuterol  inhaler 1-2 puffs every 4-6 hours as needed  Continue esomeprazole  40mg  daily daily  Start prednisone  taper 4 tabs daily x 3 days 3 tabs daily x 3 days 2 tabs daily x 3 days 1 tab daily x 3 days  Start Zpak  Continue montelukast  10mg  daily  Continue xyzal  5mg  daily  Continue flonase  nasal spray, 1 spray per nostril daily  Follow up in 6 months

## 2024-07-31 NOTE — Progress Notes (Unsigned)
 Synopsis: Referred in February 2024 for chronic cough by Raguel Blush, MD  Subjective:   PATIENT ID: Marcia Miller GENDER: female DOB: 1976/05/29, MRN: [id number]  HPI  Chief Complaint  Patient presents with   Medical Management of Chronic Issues   Marcia Miller is a 48 year old woman, never smoker with history of tuberculosis and asthma who returns to pulmonary clinic for chronic cough.   Initial OV 12/27/22 Arabic translator used throughout visit.  Patient reports intermittent cough over the past 7 years. The cough is productive with yellow mucous. She has chest tightness and shortness of breath with the cough. She has intermittent wheezing as well. She has symbicort inhaler which was prescribed in [place] which has helped her symptoms.They have recently moved to the [place]  from [place]. Strong perfumes/colognes, hand sanitizer and certain cleaning agents can bother her breathing.   She has significant reflux symptoms and has been on PPI therapy in the past. She is concerned that the steroid in symbicort makes her reflux worse. She has stomach discomfort, nausea and vomiting intermittently with eating. Denies diarrhea. No issues with weight loss.   She is a never smoker and denies second hand smoke exposure. She lives with her husband and children.   OV 05/25/23 Her symptoms increased 10 days ago. She feels every 6 months her symptoms are recurring.  She is not currently taking any medications as discussed from last visit.  They report issues picking up medications prescribed at last visit.   She complains of frequent headache and would like neurology visit.   She has not been seen by GI for her reflux disease due to issues in scheduling an appointment.  OV 06/13/23 She returns today for acute visit due to increased cough, postnasal drainage and sinus pressure/pain and headache.  She was started on Wixela inhaler 1 puff twice daily, fluticasone  nasal spray and  pantoprazole  40 mg daily at last visit and reports she does not have much improvement in her cough symptoms.  OV 09/19/23 Her cough and breathing are much improved. She has seen GI and is on pantoprazole  40mg  twice daily after EGD on 08/30/23 which showed mild gastric inflammation with biopsies negative for h. 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 Her primary care doctor also increased her advair to 250-50mcg 1 puff twice daily.   She currently reports headache, sinus congestion and sinus pressure. She has cough and throat irritation.   OV 07/31/24   Past Medical History:  Diagnosis Date   Asthma    Tuberculosis      Family History  Problem Relation Age of Onset   Liver disease Mother        ascites   Stroke Father    Liver disease Sister        ascites     Social History   Socioeconomic History   Marital status: Married    Spouse name: mohamd   Number of children: 4   Years of education: Not on file   Highest education level: Not on file  Occupational History   Not on file  Tobacco Use   Smoking status: Never    Passive exposure: Never   Smokeless tobacco: Never  Vaping Use   Vaping status: Never Used  Substance and Sexual Activity   Alcohol use: Never   Drug use: Never   Sexual activity: Yes    Partners: Male    Birth control/protection: I.U.D.  Other Topics Concern   Not on file  Social  History Narrative   Not on file   Social Drivers of Health   Financial Resource Strain: Low Risk  (08/11/2023)   Overall Financial Resource Strain (CARDIA)    Difficulty of Paying Living Expenses: Not hard at all  Food Insecurity: No Food Insecurity (08/11/2023)   Hunger Vital Sign    Worried About Running Out of Food in the Last Year: Never true    Ran Out of Food in the Last Year: Never true  Transportation Needs: No Transportation Needs (08/11/2023)   PRAPARE - Administrator, Civil Service (Medical): No    Lack of Transportation  (Non-Medical): No  Physical Activity: Inactive (08/11/2023)   Exercise Vital Sign    Days of Exercise per Week: 0 days    Minutes of Exercise per Session: 0 min  Stress: No Stress Concern Present (08/11/2023)   [employer] of Occupational Health - Occupational Stress Questionnaire    Feeling of Stress : Not at all  Social Connections: Not on file  Intimate Partner Violence: Not At Risk (08/11/2023)   Humiliation, Afraid, Rape, and Kick questionnaire    Fear of Current or Ex-Partner: No    Emotionally Abused: No    Physically Abused: No    Sexually Abused: No     No Known Allergies   Outpatient Medications Prior to Visit  Medication Sig Dispense Refill   acetaminophen  (TYLENOL ) 500 MG tablet Take 1 tablet (500 mg total) by mouth every 6 (six) hours as needed. 30 tablet 1   albuterol  (VENTOLIN  HFA) 108 (90 Base) MCG/ACT inhaler Inhale 2 puffs into the lungs every 6 (six) hours as needed for wheezing or shortness of breath. 8 g 1   esomeprazole  (NEXIUM ) 40 MG capsule Take 1 capsule (40 mg total) by mouth 2 (two) times daily before a meal. 60 capsule 2   fluticasone  (FLONASE ) 50 MCG/ACT nasal spray Place 1 spray into both nostrils daily. 16 g 2   fluticasone -salmeterol (ADVAIR DISKUS) 250-50 MCG/ACT AEPB Inhale 1 puff into the lungs in the morning and at bedtime. 60 each 5   Iron , Ferrous Sulfate , 325 (65 Fe) MG TABS Take 325 mg by mouth daily. 30 tablet 5   levocetirizine (XYZAL ) 5 MG tablet Take 1 tablet (5 mg total) by mouth every evening. 90 tablet 1   montelukast  (SINGULAIR ) 10 MG tablet Take 1 tablet (10 mg total) by mouth at bedtime. 90 tablet 3   phenazopyridine  (PYRIDIUM ) 200 MG tablet Take 1 tablet (200 mg total) by mouth 3 (three) times daily as needed for pain. 10 tablet 0   triamcinolone  (KENALOG ) 0.025 % cream Apply topically 2 (two) times daily. 30 g 0   Vonoprazan Fumarate  (VOQUEZNA ) 20 MG TABS Take 20 tablets by mouth.     No facility-administered medications prior  to visit.    Review of Systems  Constitutional:  Negative for chills, fever, malaise/fatigue and weight loss.  HENT:  Positive for congestion and sinus pain. Negative for sore throat.   Eyes: Negative.   Respiratory:  Positive for cough and sputum production. Negative for hemoptysis, shortness of breath and wheezing.   Cardiovascular:  Negative for chest pain, palpitations, orthopnea, claudication and leg swelling.  Gastrointestinal:  Positive for heartburn. Negative for abdominal pain, nausea and vomiting.  Genitourinary: Negative.   Musculoskeletal:  Negative for joint pain and myalgias.  Skin:  Negative for rash.  Neurological:  Positive for headaches. Negative for weakness.  Endo/Heme/Allergies: Negative.   Psychiatric/Behavioral: Negative.  Objective:   Vitals:   07/31/24 1016  BP: 111/76  Pulse: 71  SpO2: 98%  Weight: 155 lb (70.3 kg)  Height: 5' 4 (1.626 m)      Physical Exam Constitutional:      General: She is not in acute distress.    Appearance: She is not ill-appearing.     Comments: Coughing throughout exam  HENT:     Head: Normocephalic and atraumatic.     Nose: Mucosal edema and congestion present.     Left Turbinates: Swollen.     Left Sinus: Maxillary sinus tenderness and frontal sinus tenderness present.  Eyes:     General: No scleral icterus.    Conjunctiva/sclera: Conjunctivae normal.  Cardiovascular:     Rate and Rhythm: Normal rate and regular rhythm.     Pulses: Normal pulses.     Heart sounds: Normal heart sounds. No murmur heard. Pulmonary:     Effort: Pulmonary effort is normal.     Breath sounds: Normal breath sounds. No wheezing, rhonchi or rales.  Musculoskeletal:     Right lower leg: No edema.     Left lower leg: No edema.  Skin:    General: Skin is warm and dry.  Neurological:     General: No focal deficit present.     Mental Status: She is alert.    CBC    Component Value Date/Time   WBC 5.2 08/10/2023 0950   RBC  4.47 08/10/2023 0950   HGB 13.0 08/10/2023 0950   HGB 13.0 06/30/2022 0950   HCT CANCELED 04/20/2024 0953   PLT 219.0 08/10/2023 0950   PLT 211 06/30/2022 0950   MCV 89.8 08/10/2023 0950   MCV 92 06/30/2022 0950   MCH 29.6 06/30/2022 0950   MCH 29.9 01/22/2022 1230   MCHC 32.5 08/10/2023 0950   RDW 13.4 08/10/2023 0950   RDW 12.0 06/30/2022 0950   LYMPHSABS 1.7 08/10/2023 0950   LYMPHSABS 1.9 06/30/2022 0950   MONOABS 0.4 08/10/2023 0950   EOSABS 0.1 08/10/2023 0950   EOSABS 0.1 06/30/2022 0950   BASOSABS 0.0 08/10/2023 0950   BASOSABS 0.0 06/30/2022 0950      Latest Ref Rng & Units 08/10/2023    9:50 AM 06/30/2022    9:50 AM 01/22/2022   12:30 PM  BMP  Glucose 70 - 99 mg/dL 91  93  898   BUN 6 - 23 mg/dL 8  8  <5   Creatinine 9.59 - 1.20 mg/dL 9.43  9.36  9.36   BUN/Creat Ratio 9 - 23  13    Sodium 135 - 145 mEq/L 138  137  138   Potassium 3.5 - 5.1 mEq/L 4.0  4.6  3.8   Chloride 96 - 112 mEq/L 106  103  107   CO2 19 - 32 mEq/L 24  22  23    Calcium 8.4 - 10.5 mg/dL 9.3  9.6  89.7    Chest imaging: CXR 03/24/22 The heart size and mediastinal contours are within normal limits. Both lungs are clear. The visualized skeletal structures are unremarkable.  PFT:    Latest Ref Rng & Units 09/19/2023   12:34 PM  PFT Results  FVC-Pre L 3.24   FVC-Predicted Pre % 86   FVC-Post L 3.20   FVC-Predicted Post % 85   Pre FEV1/FVC % % 83   Post FEV1/FCV % % 86   FEV1-Pre L 2.68   FEV1-Predicted Pre % 89   FEV1-Post L 2.77   DLCO  uncorrected ml/min/mmHg 19.07   DLCO UNC% % 85   DLCO corrected ml/min/mmHg 19.07   DLCO COR %Predicted % 85   DLVA Predicted % 108   TLC L 4.67   TLC % Predicted % 89   RV % Predicted % 89     Labs:  Path:  Echo:  Heart Catheterization:  Assessment & Plan:   Mild intermittent asthma without complication  Discussion: Marcia Miller is a 48 year old woman, never smoker with history of tuberculosis and asthma who returns to pulmonary  clinic for chronic cough.   Her clinical history is concerning for asthma with triggers that include colognes/perfumes, hand sanitizer/cleaning agents and reflux disease.    Her reflux disease is under better control after starting pantoprazole  40mg  twice daily.   Her asthma is well controoled on advair 250-50mcg 1 puff twice daily. Her PFTs are within normal limits.  She again has concerns for acute sinus infection. She is to take augmentin  twice daily for 7 days. She may require referral to ENT in the future or check CT sinuses first.   She has chronic headaches, may require referral to neurology.  Follow up in 6 months.  Dorn Chill, MD [facility] Office: [phone]   Current Outpatient Medications:    acetaminophen  (TYLENOL ) 500 MG tablet, Take 1 tablet (500 mg total) by mouth every 6 (six) hours as needed., Disp: 30 tablet, Rfl: 1   albuterol  (VENTOLIN  HFA) 108 (90 Base) MCG/ACT inhaler, Inhale 2 puffs into the lungs every 6 (six) hours as needed for wheezing or shortness of breath., Disp: 8 g, Rfl: 1   esomeprazole  (NEXIUM ) 40 MG capsule, Take 1 capsule (40 mg total) by mouth 2 (two) times daily before a meal., Disp: 60 capsule, Rfl: 2   fluticasone  (FLONASE ) 50 MCG/ACT nasal spray, Place 1 spray into both nostrils daily., Disp: 16 g, Rfl: 2   fluticasone -salmeterol (ADVAIR DISKUS) 250-50 MCG/ACT AEPB, Inhale 1 puff into the lungs in the morning and at bedtime., Disp: 60 each, Rfl: 5   Iron , Ferrous Sulfate , 325 (65 Fe) MG TABS, Take 325 mg by mouth daily., Disp: 30 tablet, Rfl: 5   levocetirizine (XYZAL ) 5 MG tablet, Take 1 tablet (5 mg total) by mouth every evening., Disp: 90 tablet, Rfl: 1   montelukast  (SINGULAIR ) 10 MG tablet, Take 1 tablet (10 mg total) by mouth at bedtime., Disp: 90 tablet, Rfl: 3   phenazopyridine  (PYRIDIUM ) 200 MG tablet, Take 1 tablet (200 mg total) by mouth 3 (three) times daily as needed for pain., Disp: 10 tablet, Rfl: 0    triamcinolone  (KENALOG ) 0.025 % cream, Apply topically 2 (two) times daily., Disp: 30 g, Rfl: 0   Vonoprazan Fumarate  (VOQUEZNA ) 20 MG TABS, Take 20 tablets by mouth., Disp: , Rfl:

## 2024-08-02 LAB — RESPIRATORY ALLERGY PANEL REGION II W/ RFLX: ~~LOC~~
Allergen, A. alternata, m6: <0.1 kU/L
Allergen, Cedar tree, t12: <0.1 kU/L
Allergen, Comm Silver Birch, t9: <0.1 kU/L
Allergen, Cottonwood, t14: <0.1 kU/L
Allergen, Mouse Urine Protein, e78: <0.1 kU/L
Allergen, Mulberry, t76: <0.1 kU/L
Allergen, Oak,t7: <0.1 kU/L
Allergen, P. notatum, m1: <0.1 kU/L
Aspergillus fumigatus, m3: <0.1 kU/L
Bermuda Grass: <0.1 kU/L
Box Elder IgE: <0.1 kU/L
CLADOSPORIUM HERBARUM (M2) IGE: <0.1 kU/L
COMMON RAGWEED (SHORT) (W1) IGE: <0.1 kU/L
Cat Dander: <0.1 kU/L
Class: 0
Class: 0
Class: 0
Class: 0
Class: 0
Class: 0
Class: 0
Class: 0
Class: 0
Class: 0
Class: 0
Class: 0
Class: 0
Class: 0
Class: 0
Class: 0
Class: 0
Class: 0
Class: 0
Class: 0
Class: 0
Class: 0
Class: 0
Class: 0
Cockroach: <0.1 kU/L
D. farinae: <0.1 kU/L
Dog Dander: <0.1 kU/L
Elm IgE: <0.1 kU/L
IgE (Immunoglobulin E), Serum: 46 kU/L (ref <=114)
IgE (Immunoglobulin E), Serum: <46 kU/L (ref <=114)
Johnson Grass: <0.1 kU/L
Pecan/Hickory Tree IgE: <0.1 kU/L
Rough Pigweed  IgE: <0.1 kU/L
Sheep Sorrel IgE: <0.1 kU/L
Timothy Grass: <0.1 kU/L

## 2024-08-02 LAB — INTERPRETATION:

## 2024-08-03 ENCOUNTER — Other Ambulatory Visit (HOSPITAL_COMMUNITY): Payer: Self-pay

## 2024-08-03 ENCOUNTER — Encounter: Payer: Self-pay | Admitting: Pulmonary Disease

## 2024-08-03 ENCOUNTER — Telehealth: Payer: Self-pay

## 2024-08-03 ENCOUNTER — Ambulatory Visit: Payer: Self-pay | Admitting: Pulmonary Disease

## 2024-08-03 DIAGNOSIS — J4531 Mild persistent asthma with (acute) exacerbation: Secondary | ICD-10-CM

## 2024-08-03 NOTE — Telephone Encounter (Signed)
*  Pulm  Pharmacy Patient Advocate Encounter   Received notification from Fax that prior authorization for Fluticasone /Salmeterol is required/requested.   Insurance verification completed.   The patient is insured through [employer].   Per test claim:  Brand Advair Diskus is preferred by the insurance.  If suggested medication is appropriate, Please send in a new RX and discontinue this one. If not, please advise as to why it's not appropriate so that we may request a Prior Authorization. Please note, some preferred medications may still require a PA.  If the suggested medications have not been trialed and there are no contraindications to their use, the PA will not be submitted, as it will not be approved.   Brand Advair Diskus: $4.00

## 2024-08-03 NOTE — Progress Notes (Signed)
 Please let patient know that her allergy panel is negative for significant allergens and the eosinophil count is not elevated in a concerning range. Possibly her reflux is aggravating her breathing again.

## 2024-08-06 MED ORDER — FLUTICASONE-SALMETEROL 250-50 MCG/ACT IN AEPB: 1.0000 | INHALATION_SPRAY | Freq: Two times a day (BID) | RESPIRATORY_TRACT | 11 refills | Status: AC

## 2024-08-06 NOTE — Telephone Encounter (Signed)
 Brand advair diskus specified to pharmacy in new script.

## 2024-08-06 NOTE — Addendum Note (Signed)
 Addended by: Nissan Frazzini on: 08/06/2024 01:24 PM   Modules accepted: Orders

## 2024-08-08 ENCOUNTER — Encounter: Payer: Self-pay | Admitting: Neurology

## 2024-08-22 ENCOUNTER — Ambulatory Visit: Admitting: Obstetrics and Gynecology

## 2024-08-22 VITALS — BP 108/75 | HR 79

## 2024-08-22 DIAGNOSIS — N393 Stress incontinence (female) (male): Secondary | ICD-10-CM | POA: Diagnosis not present

## 2024-08-22 DIAGNOSIS — N812 Incomplete uterovaginal prolapse: Secondary | ICD-10-CM | POA: Diagnosis not present

## 2024-08-22 DIAGNOSIS — Z975 Presence of (intrauterine) contraceptive device: Secondary | ICD-10-CM

## 2024-08-22 DIAGNOSIS — Z30014 Encounter for initial prescription of intrauterine contraceptive device: Secondary | ICD-10-CM

## 2024-08-22 NOTE — Progress Notes (Unsigned)
 [facility] Return Visit  SUBJECTIVE  History of Present Illness: Marcia Miller is a 48 y.o. female seen in follow-up for prolapse and SUI (+ CST). She is interested in proceeding with surgery. She has considered options and does not wish to have a hysterectomy. She is enquiring if IUD can be changed at the time of surgery. It was last placed about 8-9 years ago in [place].   Print production planner used today.   Past Medical History: Patient  has a past medical history of Asthma and Tuberculosis.   Past Surgical History: She  has no past surgical history on file.   Medications: She has a current medication list which includes the following prescription(s): acetaminophen , albuterol , azithromycin , esomeprazole , fluticasone , fluticasone -salmeterol, iron  (ferrous sulfate ), levocetirizine, montelukast , phenazopyridine , and triamcinolone .   Allergies: Patient has no known allergies.   Social History: Patient  reports that she has never smoked. She has never been exposed to tobacco smoke. She has never used smokeless tobacco. She reports that she does not drink alcohol and does not use drugs.     OBJECTIVE     Physical Exam: Vitals:   08/22/24 1010  BP: 108/75  Pulse: 79   Gen: No apparent distress, A&O x 3.  Detailed Urogynecologic Evaluation:  Normal external genitalia. On speculum, normal vaginal mucosa and normal appearing cervix. On bimanual, uterus is small, mobile and nontender.   POP-Q  0                                            Aa   0                                           Ba  -6                                              C   5                                            Gh  5.5                                            Pb  10                                            tvl   -1.5                                            Ap  -1.5  Bp  -8                                              D       ASSESSMENT AND PLAN    Marcia Miller is a 48 y.o. with:  No diagnosis found.  There are no diagnoses linked to this encounter. Plan for surgery: Exam under anesthesia, anterior and posterior repair with perineorrhaphy, sacrospinous hysteropexy, midurethral sling, cystoscopy, removal and insertion of IUD  - We reviewed the patient's specific anatomic and functional findings, with the assistance of diagrams, and together finalized the above procedure. The planned surgical procedures were discussed along with the surgical risks outlined below, which were also provided on a detailed handout. Additional treatment options including expectant management, conservative management, medical management were discussed where appropriate.  We reviewed the benefits and risks of each treatment option.   General Surgical Risks: For all procedures, there are risks of bleeding, infection, damage to surrounding organs including but not limited to bowel, bladder, blood vessels, ureters and nerves, and need for further surgery if an injury were to occur. These risks are all low with minimally invasive surgery.   There are risks of numbness and weakness at any body site or buttock/rectal pain.  It is possible that baseline pain can be worsened by surgery, either with or without mesh. If surgery is vaginal, there is also a low risk of possible conversion to laparoscopy or open abdominal incision where indicated. Very rare risks include blood transfusion, blood clot, heart attack, pneumonia, or death.   There is also a risk of short-term postoperative urinary retention with need to use a catheter. About half of patients need to go home from surgery with a catheter, which is then later removed in the office. The risk of long-term need for a catheter is very low. There is also a risk of worsening of overactive bladder.   Sling: The effectiveness of a midurethral vaginal mesh sling is approximately 85%, and thus,  there will be times when you may leak urine after surgery, especially if your bladder is full or if you have a strong cough. There is a balance between making the sling tight enough to treat your leakage but not too tight so that you have long-term difficulty emptying your bladder. A mesh sling will not directly treat overactive bladder/urge incontinence and may worsen it.  There is an FDA safety notification on vaginal mesh procedures for prolapse but NOT mesh slings. We have extensive experience and training with mesh placement and we have close postoperative follow up to identify any potential complications from mesh. It is important to realize that this mesh is a permanent implant that cannot be easily removed. There are rare risks of mesh exposure (2-4%), pain with intercourse (0-7%), and infection (<1%). The risk of mesh exposure if more likely in a woman with risks for poor healing (prior radiation, poorly controlled diabetes, or immunocompromised). The risk of new or worsened chronic pain after mesh implant is more common in women with baseline chronic pain and/or poorly controlled anxiety or depression. Approximately 2-4% of patients will experience longer-term post-operative voiding dysfunction that may require surgical revision of the sling. We also reviewed that postoperatively, her stream may not be as strong as before surgery.   Prolapse (with or without mesh): Risk factors for surgical failure  include things that  put pressure on your pelvis and the surgical repair, including obesity, chronic cough, and heavy lifting or straining (including lifting children or adults, straining on the toilet, or lifting heavy objects such as furniture or anything weighing >25 lbs. Risks of recurrence is 20-30% with vaginal native tissue repair and a less than 10% with sacrocolpopexy with mesh.    Sacrocolpopexy: Mesh implants may provide more prolapse support, but do have some unique risks to consider. It is  important to understand that mesh is permanent and cannot be easily removed. Risks of abdominal sacrocolpopexy mesh include mesh exposure (~3-6%), painful intercourse (recent studies show lower rates after surgery compared to before, with ~5-8% risk of new onset), and very rare risks of bowel or bladder injury or infection (<1%). The risk of mesh exposure is more likely in a woman with risks for poor healing (prior radiation, poorly controlled diabetes, or immunocompromised). The risk of new or worsened chronic pain after mesh implant is more common in women with baseline chronic pain and/or poorly controlled anxiety or depression. There is an FDA safety notification on vaginal mesh procedures for prolapse but NOT abdominal mesh procedures and therefore does not apply to your surgery. We have extensive experience and training with mesh placement and we have close postoperative follow up to identify any potential complications from mesh.    - For preop Visit:  She is required to have a visit within 30 days of her surgery.   Today we reviewed pre-operative preparation, peri-operative expectations, and post-operative instructions/recovery.  She was provided with instructional handouts. She understands not to take aspirin  (>81mg ) or NSAIDs 7 days prior to surgery. Prescriptions provided for: Oxycodone 5mg  ***, Ibuprofen  600mg  ***, Tylenol  500mg  ***, Miralax. These prescriptions will be sent prior to surgery.  - Medical clearance: {required:24989} Letter sent to *** requesting risk stratification and medical optimization. - Anticoagulant use: {yes/no:20286} - Medicaid Hysterectomy form: {yes/no:20286} - Accepts blood transfusion: {yes/no:20286} - Expected length of stay: outpatient  Request sent for surgery scheduling.   [name], MD    [name], MD

## 2024-08-23 ENCOUNTER — Encounter: Payer: Self-pay | Admitting: Obstetrics and Gynecology

## 2024-09-04 NOTE — Progress Notes (Unsigned)
 NEUROLOGY CONSULTATION NOTE  Marcia Miller MRN: [id number] DOB: 01-30-1976  Referring provider: Raguel Blush, MD Primary care provider: Raguel Blush, MD  Reason for consult:  headache  Assessment/Plan:   Suspect chronic migraine without aura, without status migrainosus, not intractable  Check MRI of brain with and without contrast Migraine prevention:  start topiramate 25mg  at bedtime.  Side effects discussed.  Advised to keep hydrated.  We can increase to 50mg  at bedtime in 4 weeks if needed. Migraine rescue:  Stop Tylenol .  Start sumatriptan 50mg . Lifestyle modification: Limit use of pain relievers to no more than 9 days out of the month to prevent risk of rebound or medication-overuse headache. Diet modification/hydration/caffeine  cessation Routine exercise Sleep hygiene Consider vitamins/supplements:  magnesium  citrate 400mg  daily, riboflavin 400mg  daily, CoQ10 100mg  three times daily Keep headache diary Follow up 7 months.    Subjective:  Marcia Miller is a 48 year old right-handed female with history of iron  deficiency and TB who presents for headaches.  History supplemented by referring provider's note.  She is accompanied by an Arabic speaking interpreter.  Onset:  2018.  No preceding injury.  Gradually increased in frequency and has been near daily since around 2021. Location:  holocephalic, between eyes/bridge of nose Quality:  squeezing Intensity:  9/10.  Aura:  absent Prodrome:  absent Associated symptoms:  Sometimes nausea, phonophobia, neck pain.  Sometimes brief positional dizziness.  She denies associated photophobia, visual disturbance, unilateral numbness or weakness. Duration:  all day Frequency:  varies but on average 25 days a month Sometimes they wake her up from sleep. Frequency of abortive medication: 5 days a week Triggers:  unknown Relieving factors:  nothing Activity:  aggravates.  Past NSAIDS/analgesics:  Excedrin   Migraine Past abortive triptans:  absent Past abortive ergotamine:  absent Past muscle relaxants:  cyclobenzaprine  Past anti-emetic:  ondansetron -ODT 4mg  Past antihypertensive medications:  none Past antidepressant medications:  none Past anticonvulsant medications:  none Past anti-CGRP:  none Past vitamins/Herbal/Supplements:  none Past antihistamines/decongestants:  loratadine Other past therapies:  none  Current NSAIDS/analgesics:  acetaminophen  Current triptans:  none Current ergotamine:  none Current anti-emetic:  none Current muscle relaxants:  none Current Antihypertensive medications:  none Current Antidepressant medications:  none Current Anticonvulsant medications:  none Current anti-CGRP:  none Current Vitamins/Herbal/Supplements:  none Current Antihistamines/Decongestants:  Flonase , Xyzal  Other therapy:  none Birth control:  none   Caffeine :  Tea once a day.  No coffee or soda Diet:  16-32 oz water daily, rice, chicken, milk Exercise:  no Depression:  no; Anxiety:  no Sleep hygiene:  7 hours a night (11PM-6AM)  History of TBI/concussion:  no Family history of headache:  father; mother and sister have same type of headache (both found to have a GI tumor and improved after removed.  Other than nausea with headache, patient denies GI symptoms) Family history of cerebral aneurysm:  no      PAST MEDICAL HISTORY: Past Medical History:  Diagnosis Date   Asthma    Tuberculosis     PAST SURGICAL HISTORY: No past surgical history on file.  MEDICATIONS: Current Outpatient Medications on File Prior to Visit  Medication Sig Dispense Refill   acetaminophen  (TYLENOL ) 500 MG tablet Take 1 tablet (500 mg total) by mouth every 6 (six) hours as needed. 30 tablet 1   albuterol  (VENTOLIN  HFA) 108 (90 Base) MCG/ACT inhaler Inhale 2 puffs into the lungs every 6 (six) hours as needed for wheezing or shortness of breath. 8 g 11  azithromycin  (ZITHROMAX ) 250 MG tablet Take  as directed 6 tablet 0   esomeprazole  (NEXIUM ) 40 MG capsule Take 1 capsule (40 mg total) by mouth daily. 90 capsule 3   fluticasone  (FLONASE ) 50 MCG/ACT nasal spray Place 1 spray into both nostrils daily. 16 g 11   fluticasone -salmeterol (ADVAIR) 250-50 MCG/ACT AEPB Inhale 1 puff into the lungs every 12 (twelve) hours. 60 each 11   Iron , Ferrous Sulfate , 325 (65 Fe) MG TABS Take 325 mg by mouth daily. 30 tablet 5   levocetirizine (XYZAL ) 5 MG tablet Take 1 tablet (5 mg total) by mouth every evening. 90 tablet 3   montelukast  (SINGULAIR ) 10 MG tablet Take 1 tablet (10 mg total) by mouth at bedtime. 90 tablet 3   phenazopyridine  (PYRIDIUM ) 200 MG tablet Take 1 tablet (200 mg total) by mouth 3 (three) times daily as needed for pain. 10 tablet 0   triamcinolone  (KENALOG ) 0.025 % cream Apply topically 2 (two) times daily. 30 g 0   No current facility-administered medications on file prior to visit.    ALLERGIES: No Known Allergies  FAMILY HISTORY: Family History  Problem Relation Age of Onset   Liver disease Mother        ascites   Stroke Father    Liver disease Sister        ascites    Objective:  Blood pressure 97/67, pulse 78, height 5' 5 (1.651 m), weight 150 lb (68 kg), SpO2 96%. General: No acute distress.  Patient appears well-groomed.   Head:  Normocephalic/atraumatic Eyes:  fundi examined but not visualized Neck: supple, paraspinal tenderness, full range of motion Heart: regular rate and rhythm Neurological Exam: Mental status: alert and oriented to person, place, and time, speech fluent and not dysarthric, language intact. Cranial nerves: CN I: not tested CN II: pupils equal, round and reactive to light, visual fields intact CN III, IV, VI:  full range of motion, no nystagmus, no ptosis CN V: facial sensation intact. CN VII: upper and lower face symmetric CN VIII: hearing intact CN IX, X: gag intact, uvula midline CN XI: sternocleidomastoid and trapezius muscles  intact CN XII: tongue midline Bulk & Tone: normal, no fasciculations. Motor:  muscle strength 5/5 throughout Sensation:  Pinprick and vibratory sensation intact. Deep Tendon Reflexes:  2+ throughout,  toes downgoing.   Finger to nose testing:  Without dysmetria.   Gait:  Normal station and stride.  Romberg negative.    Thank you for allowing me to take part in the care of this patient.  Juliene Dunnings, DO  CC: Raguel Blush, MD

## 2024-09-05 ENCOUNTER — Ambulatory Visit: Admitting: Neurology

## 2024-09-05 ENCOUNTER — Encounter: Payer: Self-pay | Admitting: Neurology

## 2024-09-05 VITALS — BP 97/67 | HR 78 | Ht 65.0 in | Wt 150.0 lb

## 2024-09-05 DIAGNOSIS — G43709 Chronic migraine without aura, not intractable, without status migrainosus: Secondary | ICD-10-CM

## 2024-09-05 MED ORDER — TOPIRAMATE 25 MG PO TABS: 25.0000 mg | ORAL_TABLET | Freq: Every day | ORAL | 5 refills | Status: DC

## 2024-09-05 MED ORDER — SUMATRIPTAN SUCCINATE 50 MG PO TABS: 50.0000 mg | ORAL_TABLET | ORAL | 5 refills | Status: DC | PRN

## 2024-09-05 NOTE — Patient Instructions (Addendum)
  Will check MRI of brain with and without contrast. We have sent a referral to [facility] for your MRI and they will call you directly to schedule your appointment. They are located at [address] [facility]. If you need to contact them directly please call [phone].  Start topiramate 25mg  at bedtime.  Contact us  in 4 weeks with update and we can increase dose if needed. Discontinue Tylenol .  Take sumatriptan 50mg  at earliest onset of headache.  May repeat dose once in 2 hours if needed.  Maximum 2 tablets in 24 hours. Limit use of pain relievers to no more than 9 days out of the month.  These medications include acetaminophen , NSAIDs (ibuprofen /Advil /Motrin , naproxen/Aleve, triptans (Imitrex/sumatriptan), Excedrin , and narcotics.  This will help reduce risk of rebound headaches. Be aware of common food triggers: Routine exercise Stay adequately hydrated (aim for 64 oz water daily) Keep headache diary Maintain proper stress management Maintain proper sleep hygiene Do not skip meals Consider supplements:  magnesium  citrate 400mg  daily, riboflavin 400mg  daily, coenzyme Q10 100mg  three times daily.   ? ????????? ??????? ?????? ????? ??????? ??????????? ???????? ?? ??????? ?????? ?????. ??? ?????? ?? 315 ??? ???? ???????. ??????? ???? ???????? ????? ??????? ??? ???-  ?. ???? ??? ????? ?????? ??????? ?????????? ?? ????? ?????. ?. ???? ?????? ????????? ?? ??? ??? ?????. ????? ???? ???? ? ?????? ???????? ??????????? ??????? ????? ?????? ??? ??? ?????. ?. ???? ?? ????? ????????. ????? ??????????? ?? ??? ??? ??? ???? ??????. ???? ????? ?????? ??? ?? ?????? ??? ??? ?????. ???? ?????? ?????? ????? ???? ?? ????. ?. ??? ??????? ?????? ????? ??? ? ???? ??? ?? ?????. ???? ??? ??????? ??????????????? ??????? ???????? ??? ??????????? (??????????/?????/??????? ?????????/????? ??????????? (????????/???????????)? ????????? ?????????. ?????? ??? ?? ????? ??? ?????? ?????????.  5. ????? ???????? ????????  ???????:  6. ?????? ??????? ???????.  7. ???? ??? ????? ???? (???? 64 ????? ?? ????? ??????).  8. ???? ???????? ?? ??????.  9. ???? ??? ????? ?????? ???? ????.  10. ???? ??? ????? ????.  11. ?? ???? ????? ??????.  12. ????? ???????? ????????: ????? ?????????? 400 ??? ??????? ?????????? 400 ??? ??????? ????? ???10 ??????? 100 ??? ???? ???? ??????. 1. sayatimu fahs taswir aldimagh bialranin almighnatisii mae wabidun tabayunin.laqad 'arsalna lak 'iihalatan 'iilaa markaz ghrinzburu liltaswir li'iijra' taswir bialranin almighnatisi, wasayatasilun bik mbashrtan litahdid maweidika. yaqae almarkaz fi [address] gharb sharie windufar. liltawasul maeahum mbashrtan, yurja aliatisal ealaa alraqm [phone]. 2. aibda bitanawul tubiramit 25 milaghin qabl alnuwm. tuasil maeana khilal 4 'asabie li'iiblaghina bialtahdithati, wayumkinuna ziadat aljureat 'iidha lazim al'amra. 3. tawaqaf ean tanawul taylinul. tanawal sumatribtan 50 mulagh eind 'awal zuhur lilsudaei. yumkin takrar aljureat maratan kulu saeatayn 'iidha lazim al'amra. alhada al'aqsaa liljureat qursan khilal 24 saeatin. 4. qalal aistikhdam musakinat al'alam 'iilaa 9 'ayaam faqat fi alshahra. tashmal hadhih al'adwiat al'asitaminufin, wamudadaat alailtihab ghayr alsitiruidia ('iibubrufin/adifil/mutrin, nabruksin/alif, altiribatanat ('iimitrikisi/sumatribtan), 'iiksidrin, walmuskinati. sayusaeid dhalik fi taqlil khatar alsudae aliartidadii. 5. antabih lilmuhafizat alghidhayiyat alshaayieati: 6. mumarasat alriyadat biaintizami. 7. hafiz ealaa rutubat jismik (ashrab 64 'uwnisat min alma' ywmyan). 8. dwwn mulahazatik ean alsudaei. 9. hafaz ealaa 'iidarat altawatur bishakl sahihin. 10. hafiz ealaa nazafat numik. 11. la tufawit wajabat altaeami. 12. tanawul almukamilat alghidhayiyati: sutrat almaghnisyum 400 milgh ywmyan, ribuflafin 400 milgh ywmyan, 'anzim kyu10 almusaeid 100 malagh thalath maraat ywmyan.

## 2024-09-12 ENCOUNTER — Encounter: Payer: Self-pay | Admitting: Obstetrics and Gynecology

## 2024-09-13 ENCOUNTER — Encounter: Payer: Self-pay | Admitting: Obstetrics and Gynecology

## 2024-09-24 ENCOUNTER — Telehealth: Payer: Self-pay | Admitting: Neurology

## 2024-09-24 NOTE — Telephone Encounter (Signed)
 Caller says she was seen on 09/05/24 and was prescribed two meds for HA. If she takes the one that is as necessary, once she gets the pain, she can't get rid of HA for hours. Her HA is very severe, she almost went to ER 09/23/24.  Pain 10/10

## 2024-09-24 NOTE — Telephone Encounter (Signed)
 Tried calling patient no answer. She may come in for a headache cocktail (if she has a driver), otherwise she would need to go to the ED.   Instead of sumatriptan , if she is agreeable, please prescribe RIZATRIPTAN  10MG  - TAKE 1 TABLET AS NEEDED.  MAY REPEAT AFTER 2 HOURS.  MAXIMUM 2 TABLETS IN 24 HOURS.  5 REFILLS.  Discontinue sumatriptan 

## 2024-09-25 MED ORDER — RIZATRIPTAN BENZOATE 10 MG PO TABS: 10.0000 mg | ORAL_TABLET | ORAL | 5 refills | Status: AC | PRN

## 2024-09-25 NOTE — Telephone Encounter (Signed)
 Speaking to the patient and her Neighbor Robin.  Patient states she was seen at Urgent Care for Headache cocktail on 09/22/24 it helped some but she still  has some pain.   She is taking the Topiramate  daily and the Sumatriptan  as needed. The Sumatriptan  make her Migraine worse when she takes it.  Will be willing to try the Rizatriptan  as directed in Dr.jaffe note from 09/24/24.   Conversation was translated by [employer] on the patient and neighbor phone. They were at the pharmacy already. Picking up Excedrin  migraine per her PCP.

## 2024-09-26 ENCOUNTER — Ambulatory Visit: Admitting: Neurology

## 2024-10-05 ENCOUNTER — Other Ambulatory Visit: Payer: Self-pay | Admitting: Neurology

## 2024-10-05 ENCOUNTER — Telehealth: Payer: Self-pay | Admitting: Neurology

## 2024-10-05 DIAGNOSIS — G43709 Chronic migraine without aura, not intractable, without status migrainosus: Secondary | ICD-10-CM

## 2024-10-05 MED ORDER — TOPIRAMATE 50 MG PO TABS: 50.0000 mg | ORAL_TABLET | Freq: Every day | ORAL | 0 refills | Status: DC

## 2024-10-05 MED ORDER — TOPIRAMATE 25 MG PO TABS: 50.0000 mg | ORAL_TABLET | Freq: Every day | ORAL | 5 refills | Status: DC

## 2024-10-05 NOTE — Telephone Encounter (Signed)
 Spoke to [employer], Advised of note from 09/24/24.  Patient was supposed to pick up new medication Rizatriptan . And stop the Sumatriptan .   Please pick up the Rizatriptan  that was sent in.   If the patient is still in pain please go to the ED/Urgent Care for A cocktail.  Dr.Jaffe is out of the office today I will send a message to see if we can increase the Topiramate  per his ov note since we are four weeks out from her start date.

## 2024-10-05 NOTE — Telephone Encounter (Signed)
 Pt called in today with her interpreter and her name was Marcia Miller and her number is [phone] . Thu stated that pt stated that she having   migraines headaches with the prescriptions called: Topiramate  25 mg, and Sumatriptan  50mg  . Pt also went to the emergency last month due to the migraine headaches. Please call.

## 2024-10-05 NOTE — Telephone Encounter (Signed)
 Interpreter advised of DR.Hill note, Tell her to increase the topamax  to 50 mg daily. I sent in a new prescription for her.   RX sent.

## 2024-10-09 ENCOUNTER — Encounter: Payer: Self-pay | Admitting: Neurology

## 2024-10-15 ENCOUNTER — Inpatient Hospital Stay: Admission: RE | Admit: 2024-10-15 | Discharge: 2024-10-15 | Attending: Neurology

## 2024-10-15 DIAGNOSIS — G43709 Chronic migraine without aura, not intractable, without status migrainosus: Secondary | ICD-10-CM

## 2024-10-15 MED ORDER — GADOPICLENOL 0.5 MMOL/ML IV SOLN
7.5000 mL | Freq: Once | INTRAVENOUS | Status: AC | PRN
  Administered 2024-10-15: 15:00:00 7.5 mL via INTRAVENOUS

## 2024-10-18 ENCOUNTER — Ambulatory Visit: Admitting: Family Medicine

## 2024-10-18 VITALS — BP 99/75 | HR 68 | Ht 65.0 in | Wt 160.0 lb

## 2024-10-18 DIAGNOSIS — E611 Iron deficiency: Secondary | ICD-10-CM | POA: Diagnosis not present

## 2024-10-18 DIAGNOSIS — D649 Anemia, unspecified: Secondary | ICD-10-CM | POA: Diagnosis not present

## 2024-10-18 DIAGNOSIS — R413 Other amnesia: Secondary | ICD-10-CM

## 2024-10-18 DIAGNOSIS — Z789 Other specified health status: Secondary | ICD-10-CM

## 2024-10-18 MED ORDER — IRON (FERROUS SULFATE) 325 (65 FE) MG PO TABS: 325.0000 mg | ORAL_TABLET | Freq: Every day | ORAL | 5 refills | Status: AC

## 2024-10-19 ENCOUNTER — Encounter: Payer: Self-pay | Admitting: Obstetrics and Gynecology

## 2024-10-19 ENCOUNTER — Other Ambulatory Visit: Payer: Self-pay | Admitting: Obstetrics and Gynecology

## 2024-10-19 ENCOUNTER — Encounter: Payer: Self-pay | Admitting: Family Medicine

## 2024-10-19 ENCOUNTER — Ambulatory Visit: Admitting: Obstetrics and Gynecology

## 2024-10-19 VITALS — BP 110/75 | HR 74 | Ht 65.0 in | Wt 158.0 lb

## 2024-10-19 DIAGNOSIS — Z01818 Encounter for other preprocedural examination: Secondary | ICD-10-CM

## 2024-10-19 LAB — CBC WITH DIFFERENTIAL/PLATELET
Basophils Absolute: 0 x10E3/uL (ref 0.0–0.2)
Basos: 0 %
EOS (ABSOLUTE): 0.1 x10E3/uL (ref 0.0–0.4)
Eos: 1 %
Hematocrit: 41.4 % (ref 34.0–46.6)
Hemoglobin: 13 g/dL (ref 11.1–15.9)
Immature Grans (Abs): 0 x10E3/uL (ref 0.0–0.1)
Immature Granulocytes: 0 %
Lymphocytes Absolute: 2 x10E3/uL (ref 0.7–3.1)
Lymphs: 33 %
MCH: 29.7 pg (ref 26.6–33.0)
MCHC: 31.4 g/dL — ABNORMAL LOW (ref 31.5–35.7)
MCV: 95 fL (ref 79–97)
Monocytes Absolute: 0.4 x10E3/uL (ref 0.1–0.9)
Monocytes: 7 %
Neutrophils Absolute: 3.5 x10E3/uL (ref 1.4–7.0)
Neutrophils: 59 %
Platelets: 199 x10E3/uL (ref 150–450)
RBC: 4.38 x10E6/uL (ref 3.77–5.28)
RDW: 12.7 % (ref 11.7–15.4)
WBC: 6 x10E3/uL (ref 3.4–10.8)

## 2024-10-19 LAB — IRON,TIBC AND FERRITIN PANEL
Ferritin: 38 ng/mL (ref 15–150)
Iron Saturation: 24 % (ref 15–55)
Iron: 69 ug/dL (ref 27–159)
Total Iron Binding Capacity: 282 ug/dL (ref 250–450)
UIBC: 213 ug/dL (ref 131–425)

## 2024-10-19 MED ORDER — POLYETHYLENE GLYCOL 3350 17 GM/SCOOP PO POWD: 17.0000 g | Freq: Every day | ORAL | 0 refills | Status: AC

## 2024-10-19 MED ORDER — ACETAMINOPHEN 500 MG PO TABS: 500.0000 mg | ORAL_TABLET | Freq: Four times a day (QID) | ORAL | 0 refills | Status: AC | PRN

## 2024-10-19 MED ORDER — IBUPROFEN 600 MG PO TABS: 600.0000 mg | ORAL_TABLET | Freq: Four times a day (QID) | ORAL | 0 refills | Status: AC | PRN

## 2024-10-19 MED ORDER — OXYCODONE HCL 5 MG PO TABS: 5.0000 mg | ORAL_TABLET | ORAL | 0 refills | Status: AC | PRN

## 2024-10-19 NOTE — Progress Notes (Signed)
 [facility] Pre-Operative Exam  Subjective Chief Complaint: Marcia Miller presents for a preoperative encounter.   History of Present Illness: Marcia Miller is a 48 y.o. female who presents for preoperative visit.  She is scheduled to undergo Exam under anesthesia, anterior and posterior repair with perineorrhaphy, sacrospinous hysteropexy, midurethral sling, cystoscopy, removal and insertion of IUD  on 11/05/24.  Her symptoms include pelvic organ prolapse and stress urinary incontinence, and she was was found to have Stage II anterior, Stage I posterior, Stage I apical prolapse.   Urodynamics showed: Deferred, +CST on exam.   Past Medical History:  Diagnosis Date   Asthma    Heart burn    Tuberculosis      History reviewed. No pertinent surgical history.  has no known allergies.   Family History  Problem Relation Age of Onset   Liver disease Mother        ascites   Stroke Father    Liver disease Sister        ascites    Social History[1]   Review of Systems was negative for a full 10 system review except as noted in the History of Present Illness.  Current Medications[2]   Objective Vitals:   10/19/24 0859  BP: 110/75  Pulse: 74    Gen: NAD CV: S1 S2 RRR Lungs: Clear to auscultation bilaterally Abd: soft, nontender   Previous Pelvic Exam showed: POP-Q   0                                            Aa   0                                           Ba   -6                                              C    5                                            Gh   5.5                                            Pb   10                                            tvl    -1.5                                            Ap   -1.5  Bp   -8                                             Assessment/ Plan  Assessment: The patient is a 48 y.o. year old scheduled to undergo Exam under  anesthesia, anterior and posterior repair with perineorrhaphy, sacrospinous hysteropexy, midurethral sling, cystoscopy, removal and insertion of IUD  . Verbal consent was obtained for these procedures.  Plan: General Surgical Consent: The patient has previously been counseled on alternative treatments, and the decision by the patient and provider was to proceed with the procedure listed above.  For all procedures, there are risks of bleeding, infection, damage to surrounding organs including but not limited to bowel, bladder, blood vessels, ureters and nerves, and need for further surgery if an injury were to occur. These risks are all low with minimally invasive surgery.   There are risks of numbness and weakness at any body site or buttock/rectal pain.  It is possible that baseline pain can be worsened by surgery, either with or without mesh. If surgery is vaginal, there is also a low risk of possible conversion to laparoscopy or open abdominal incision where indicated. Very rare risks include blood transfusion, blood clot, heart attack, pneumonia, or death.   There is also a risk of short-term postoperative urinary retention with need to use a catheter. About half of patients need to go home from surgery with a catheter, which is then later removed in the office. The risk of long-term need for a catheter is very low. There is also a risk of worsening of overactive bladder.   Sling: The effectiveness of a midurethral vaginal mesh sling is approximately 85%, and thus, there will be times when you may leak urine after surgery, especially if your bladder is full or if you have a strong cough. There is a balance between making the sling tight enough to treat your leakage but not too tight so that you have long-term difficulty emptying your bladder. A mesh sling will not directly treat overactive bladder/urge incontinence and may worsen it.  There is an FDA safety notification on vaginal mesh procedures  for prolapse but NOT mesh slings. We have extensive experience and training with mesh placement and we have close postoperative follow up to identify any potential complications from mesh. It is important to realize that this mesh is a permanent implant that cannot be easily removed. There are rare risks of mesh exposure (2-4%), pain with intercourse (0-7%), and infection (<1%). The risk of mesh exposure if more likely in a woman with risks for poor healing (prior radiation, poorly controlled diabetes, or immunocompromised). The risk of new or worsened chronic pain after mesh implant is more common in women with baseline chronic pain and/or poorly controlled anxiety or depression. Approximately 2-4% of patients will experience longer-term post-operative voiding dysfunction that may require surgical revision of the sling. We also reviewed that postoperatively, her stream may not be as strong as before surgery.    Prolapse (with or without mesh): Risk factors for surgical failure  include things that put pressure on your pelvis and the surgical repair, including obesity, chronic cough, and heavy lifting or straining (including lifting children or adults, straining on the toilet, or lifting heavy objects such as furniture or anything weighing >25 lbs. Risks of recurrence is 20-30% with vaginal native tissue repair and a less  than 10% with sacrocolpopexy with mesh.    We discussed consent for blood products. Risks for blood transfusion include allergic reactions, other reactions that can affect different body organs and managed accordingly, transmission of infectious diseases such as HIV or Hepatitis. However, the blood is screened. Patient consents for blood products.  Pre-operative instructions:  She was instructed to not take Aspirin /NSAIDs x 7days prior to surgery.  Antibiotic prophylaxis was ordered as indicated.  Catheter use: Patient will go home with foley if needed after post-operative voiding  trial.  Post-operative instructions:  She was provided with specific post-operative instructions, including precautions and signs/symptoms for which we would recommend contacting us , in addition to daytime and after-hours contact phone numbers. This was provided on a handout.   Post-operative medications: Prescriptions for motrin , tylenol , miralax , and oxycodone  were sent to her pharmacy. Discussed using ibuprofen  and tylenol  on a schedule to limit use of narcotics.   Laboratory testing:  We will check labs: As requested by anesthesia   Preoperative clearance:  She does not require surgical clearance.    Post-operative follow-up:  A post-operative appointment will be made for 6 weeks from the date of surgery. If she needs a post-operative nurse visit for a voiding trial, that will be set up after she leaves the hospital.    Patient will call the clinic or use MyChart should anything change or any new issues arise.   Mairi Stagliano G Macyn Remmert, NP       [1]  Social History Tobacco Use   Smoking status: Never    Passive exposure: Never   Smokeless tobacco: Never  Vaping Use   Vaping status: Never Used  Substance Use Topics   Alcohol use: Never   Drug use: Never  [2]  Current Outpatient Medications:    acetaminophen  (TYLENOL ) 500 MG tablet, Take 1 tablet (500 mg total) by mouth every 6 (six) hours as needed., Disp: 30 tablet, Rfl: 1   acetaminophen  (TYLENOL ) 500 MG tablet, Take 1 tablet (500 mg total) by mouth every 6 (six) hours as needed (pain)., Disp: 30 tablet, Rfl: 0   albuterol  (VENTOLIN  HFA) 108 (90 Base) MCG/ACT inhaler, Inhale 2 puffs into the lungs every 6 (six) hours as needed for wheezing or shortness of breath., Disp: 8 g, Rfl: 11   esomeprazole  (NEXIUM ) 40 MG capsule, Take 1 capsule (40 mg total) by mouth daily., Disp: 90 capsule, Rfl: 3   fluticasone  (FLONASE ) 50 MCG/ACT nasal spray, Place 1 spray into both nostrils daily., Disp: 16 g, Rfl: 11   fluticasone -salmeterol  (ADVAIR) 250-50 MCG/ACT AEPB, Inhale 1 puff into the lungs every 12 (twelve) hours., Disp: 60 each, Rfl: 11   ibuprofen  (ADVIL ) 600 MG tablet, Take 1 tablet (600 mg total) by mouth every 6 (six) hours as needed., Disp: 30 tablet, Rfl: 0   Iron , Ferrous Sulfate , 325 (65 Fe) MG TABS, Take 325 mg by mouth daily., Disp: 30 tablet, Rfl: 5   levocetirizine (XYZAL ) 5 MG tablet, Take 1 tablet (5 mg total) by mouth every evening., Disp: 90 tablet, Rfl: 3   montelukast  (SINGULAIR ) 10 MG tablet, Take 1 tablet (10 mg total) by mouth at bedtime., Disp: 90 tablet, Rfl: 3   oxyCODONE  (OXY IR/ROXICODONE ) 5 MG immediate release tablet, Take 1 tablet (5 mg total) by mouth every 4 (four) hours as needed for severe pain (pain score 7-10)., Disp: 15 tablet, Rfl: 0   phenazopyridine  (PYRIDIUM ) 200 MG tablet, Take 1 tablet (200 mg total) by mouth 3 (three) times daily  as needed for pain., Disp: 10 tablet, Rfl: 0   polyethylene glycol powder (GLYCOLAX /MIRALAX ) 17 GM/SCOOP powder, Take 17 g by mouth daily. Drink 17g (1 scoop) dissolved in water per day., Disp: 255 g, Rfl: 0   rizatriptan  (MAXALT ) 10 MG tablet, Take 1 tablet (10 mg total) by mouth as needed for migraine. May repeat in 2 hours if needed, Disp: 10 tablet, Rfl: 5   topiramate  (TOPAMAX ) 50 MG tablet, Take 1 tablet (50 mg total) by mouth at bedtime., Disp: 30 tablet, Rfl: 0   triamcinolone  (KENALOG ) 0.025 % cream, Apply topically 2 (two) times daily., Disp: 30 g, Rfl: 0

## 2024-10-19 NOTE — Progress Notes (Signed)
 Attempted to call pt to relay lab results - no answer, unable to LVM

## 2024-10-19 NOTE — H&P (Signed)
 " [facility] H&P  Subjective Chief Complaint: Marcia Miller presents for a preoperative encounter.   History of Present Illness: Marcia Miller is a 48 y.o. female who presents for preoperative visit.  She is scheduled to undergo Exam under anesthesia, anterior and posterior repair with perineorrhaphy, sacrospinous hysteropexy, midurethral sling, cystoscopy, removal and insertion of IUD  on 11/05/24.  Her symptoms include pelvic organ prolapse and stress urinary incontinence, and she was was found to have Stage II anterior, Stage I posterior, Stage I apical prolapse.   Urodynamics showed: Deferred, +CST on exam.   Past Medical History:  Diagnosis Date   Asthma    Heart burn    Tuberculosis      No past surgical history on file.  has no known allergies.   Family History  Problem Relation Age of Onset   Liver disease Mother        ascites   Stroke Father    Liver disease Sister        ascites    Social History[1]   Review of Systems was negative for a full 10 system review except as noted in the History of Present Illness.  Current Medications[2]   Objective There were no vitals filed for this visit.   Gen: NAD CV: S1 S2 RRR Lungs: Clear to auscultation bilaterally Abd: soft, nontender   Previous Pelvic Exam showed: POP-Q   0                                            Aa   0                                           Ba   -6                                              C    5                                            Gh   5.5                                            Pb   10                                            tvl    -1.5                                            Ap   -1.5  Bp   -8                                             Assessment/ Plan  Assessment: The patient is a 48 y.o. year old scheduled to undergo Exam under anesthesia, anterior and posterior  repair with perineorrhaphy, sacrospinous hysteropexy, midurethral sling, cystoscopy, removal and insertion of IUD  . Verbal consent was obtained for these procedures.     [1]  Social History Tobacco Use   Smoking status: Never    Passive exposure: Never   Smokeless tobacco: Never  Vaping Use   Vaping status: Never Used  Substance Use Topics   Alcohol use: Never   Drug use: Never  [2] No current facility-administered medications for this encounter.  Current Outpatient Medications:    acetaminophen  (TYLENOL ) 500 MG tablet, Take 1 tablet (500 mg total) by mouth every 6 (six) hours as needed., Disp: 30 tablet, Rfl: 1   acetaminophen  (TYLENOL ) 500 MG tablet, Take 1 tablet (500 mg total) by mouth every 6 (six) hours as needed (pain)., Disp: 30 tablet, Rfl: 0   albuterol  (VENTOLIN  HFA) 108 (90 Base) MCG/ACT inhaler, Inhale 2 puffs into the lungs every 6 (six) hours as needed for wheezing or shortness of breath., Disp: 8 g, Rfl: 11   esomeprazole  (NEXIUM ) 40 MG capsule, Take 1 capsule (40 mg total) by mouth daily., Disp: 90 capsule, Rfl: 3   fluticasone  (FLONASE ) 50 MCG/ACT nasal spray, Place 1 spray into both nostrils daily., Disp: 16 g, Rfl: 11   fluticasone -salmeterol (ADVAIR) 250-50 MCG/ACT AEPB, Inhale 1 puff into the lungs every 12 (twelve) hours., Disp: 60 each, Rfl: 11   ibuprofen  (ADVIL ) 600 MG tablet, Take 1 tablet (600 mg total) by mouth every 6 (six) hours as needed., Disp: 30 tablet, Rfl: 0   Iron , Ferrous Sulfate , 325 (65 Fe) MG TABS, Take 325 mg by mouth daily., Disp: 30 tablet, Rfl: 5   levocetirizine (XYZAL ) 5 MG tablet, Take 1 tablet (5 mg total) by mouth every evening., Disp: 90 tablet, Rfl: 3   montelukast  (SINGULAIR ) 10 MG tablet, Take 1 tablet (10 mg total) by mouth at bedtime., Disp: 90 tablet, Rfl: 3   oxyCODONE (OXY IR/ROXICODONE) 5 MG immediate release tablet, Take 1 tablet (5 mg total) by mouth every 4 (four) hours as needed for severe pain (pain score 7-10)., Disp: 15  tablet, Rfl: 0   phenazopyridine  (PYRIDIUM ) 200 MG tablet, Take 1 tablet (200 mg total) by mouth 3 (three) times daily as needed for pain., Disp: 10 tablet, Rfl: 0   polyethylene glycol powder (GLYCOLAX/MIRALAX) 17 GM/SCOOP powder, Take 17 g by mouth daily. Drink 17g (1 scoop) dissolved in water per day., Disp: 255 g, Rfl: 0   rizatriptan  (MAXALT ) 10 MG tablet, Take 1 tablet (10 mg total) by mouth as needed for migraine. May repeat in 2 hours if needed, Disp: 10 tablet, Rfl: 5   topiramate  (TOPAMAX ) 50 MG tablet, Take 1 tablet (50 mg total) by mouth at bedtime., Disp: 30 tablet, Rfl: 0   triamcinolone  (KENALOG ) 0.025 % cream, Apply topically 2 (two) times daily., Disp: 30 g, Rfl: 0  "

## 2024-10-19 NOTE — Progress Notes (Signed)
 "  Established Patient Office Visit  Subjective    Patient ID: Marcia Miller, female    DOB: 1975-11-22  Age: 48 y.o. MRN: [id number]  CC:  Chief Complaint  Patient presents with   Medical Management of Chronic Issues    Pt reports memory issues - she is very forgetful     HPI Marcia Miller presents for follow up of anemia. She also complains of memory issues that is causing her great concern.  This visit was aided by an interpreter.   Outpatient Encounter Medications as of 10/18/2024  Medication Sig   acetaminophen  (TYLENOL ) 500 MG tablet Take 1 tablet (500 mg total) by mouth every 6 (six) hours as needed.   albuterol  (VENTOLIN  HFA) 108 (90 Base) MCG/ACT inhaler Inhale 2 puffs into the lungs every 6 (six) hours as needed for wheezing or shortness of breath.   esomeprazole  (NEXIUM ) 40 MG capsule Take 1 capsule (40 mg total) by mouth daily.   fluticasone  (FLONASE ) 50 MCG/ACT nasal spray Place 1 spray into both nostrils daily.   fluticasone -salmeterol (ADVAIR) 250-50 MCG/ACT AEPB Inhale 1 puff into the lungs every 12 (twelve) hours.   levocetirizine (XYZAL ) 5 MG tablet Take 1 tablet (5 mg total) by mouth every evening.   montelukast  (SINGULAIR ) 10 MG tablet Take 1 tablet (10 mg total) by mouth at bedtime.   rizatriptan  (MAXALT ) 10 MG tablet Take 1 tablet (10 mg total) by mouth as needed for migraine. May repeat in 2 hours if needed   topiramate  (TOPAMAX ) 50 MG tablet Take 1 tablet (50 mg total) by mouth at bedtime.   [DISCONTINUED] Iron , Ferrous Sulfate , 325 (65 Fe) MG TABS Take 325 mg by mouth daily.   Iron , Ferrous Sulfate , 325 (65 Fe) MG TABS Take 325 mg by mouth daily.   phenazopyridine  (PYRIDIUM ) 200 MG tablet Take 1 tablet (200 mg total) by mouth 3 (three) times daily as needed for pain.   triamcinolone  (KENALOG ) 0.025 % cream Apply topically 2 (two) times daily.   [DISCONTINUED] azithromycin  (ZITHROMAX ) 250 MG tablet Take as directed (Patient not taking:  Reported on 10/18/2024)   No facility-administered encounter medications on file as of 10/18/2024.    Past Medical History:  Diagnosis Date   Asthma    Heart burn    Tuberculosis     History reviewed. No pertinent surgical history.  Family History  Problem Relation Age of Onset   Liver disease Mother        ascites   Stroke Father    Liver disease Sister        ascites    Social History   Socioeconomic History   Marital status: Married    Spouse name: mohamd   Number of children: 4   Years of education: Not on file   Highest education level: Not on file  Occupational History   Not on file  Tobacco Use   Smoking status: Never    Passive exposure: Never   Smokeless tobacco: Never  Vaping Use   Vaping status: Never Used  Substance and Sexual Activity   Alcohol use: Never   Drug use: Never   Sexual activity: Yes    Partners: Male    Birth control/protection: I.U.D.  Other Topics Concern   Not on file  Social History Narrative   Right handed   Social Drivers of Health   Tobacco Use: Low Risk (10/19/2024)   Patient History    Smoking Tobacco Use: Never    Smokeless Tobacco Use:  Never    Passive Exposure: Never  Financial Resource Strain: Low Risk (08/11/2023)   Overall Financial Resource Strain (CARDIA)    Difficulty of Paying Living Expenses: Not hard at all  Food Insecurity: No Food Insecurity (08/11/2023)   Hunger Vital Sign    Worried About Running Out of Food in the Last Year: Never true    Ran Out of Food in the Last Year: Never true  Transportation Needs: No Transportation Needs (08/11/2023)   PRAPARE - Administrator, Civil Service (Medical): No    Lack of Transportation (Non-Medical): No  Physical Activity: Inactive (08/11/2023)   Exercise Vital Sign    Days of Exercise per Week: 0 days    Minutes of Exercise per Session: 0 min  Stress: No Stress Concern Present (08/11/2023)   [employer] of Occupational Health -  Occupational Stress Questionnaire    Feeling of Stress : Not at all  Social Connections: Not on file  Intimate Partner Violence: Not At Risk (08/11/2023)   Humiliation, Afraid, Rape, and Kick questionnaire    Fear of Current or Ex-Partner: No    Emotionally Abused: No    Physically Abused: No    Sexually Abused: No  Depression (PHQ2-9): Low Risk (04/18/2024)   Depression (PHQ2-9)    PHQ-2 Score: 0  Alcohol Screen: Low Risk (08/11/2023)   Alcohol Screen    Last Alcohol Screening Score (AUDIT): 0  Housing: Low Risk (08/11/2023)   Housing    Last Housing Risk Score: 0  Utilities: Not At Risk (08/11/2023)   AHC Utilities    Threatened with loss of utilities: No  Health Literacy: Adequate Health Literacy (08/11/2023)   B1300 Health Literacy    Frequency of need for help with medical instructions: Never    Review of Systems  All other systems reviewed and are negative.       Objective    BP 99/75   Pulse 68   Ht 5' 5 (1.651 m)   Wt 160 lb (72.6 kg)   LMP 09/01/2024 (Exact Date)   SpO2 98%   BMI 26.63 kg/m   Physical Exam Vitals and nursing note reviewed.  Constitutional:      General: She is not in acute distress. Cardiovascular:     Rate and Rhythm: Normal rate and regular rhythm.  Pulmonary:     Effort: Pulmonary effort is normal.     Breath sounds: Normal breath sounds.  Neurological:     General: No focal deficit present.     Mental Status: She is alert and oriented to person, place, and time.  Psychiatric:        Mood and Affect: Mood normal.        Behavior: Behavior normal.        Thought Content: Thought content normal.         Assessment & Plan:   Memory changes -     Ambulatory referral to Neurology  Anemia, unspecified type -     CBC with Differential/Platelet -     Iron , TIBC and Ferritin Panel  Low iron   Language barrier to communication  Other orders -     Iron  (Ferrous Sulfate ); Take 325 mg by mouth daily.  Dispense: 30 tablet;  Refill: 5     Return in about 6 months (around 04/18/2025) for follow up.   Tanda Raguel SQUIBB, MD  "

## 2024-10-22 ENCOUNTER — Ambulatory Visit: Payer: Self-pay | Admitting: Neurology

## 2024-10-22 DIAGNOSIS — G43709 Chronic migraine without aura, not intractable, without status migrainosus: Secondary | ICD-10-CM

## 2024-10-23 ENCOUNTER — Encounter (HOSPITAL_COMMUNITY): Payer: Self-pay | Admitting: Obstetrics and Gynecology

## 2024-10-23 NOTE — Progress Notes (Signed)
 Spoke w/ via phone for pre-op interview--- [place]ana. [place] interpreter ID# [id number] Lab needs dos----    Surgeon orders requested 10/22/24.     Lab results------ COVID test -----patient states asymptomatic no test needed Arrive at -------1000 NPO after MN NO Solid Food.   Pre-Surgery Ensure or G2:  Med rec completed Medications to take morning of surgery -----NONE Diabetic medication -----  GLP1 agonist last dose: GLP1 instructions:  Patient instructed no nail polish to be worn day of surgery Patient instructed to bring photo id and insurance card day of surgery Patient aware to have Driver (ride ) / caregiver    for 24 hours after surgery - Husband Roderic Rice Patient Special Instructions ----- Pre-Op special Instructions -----Arabic interpreter requested for day of surgery on 10/23/24.  Patient verbalized understanding of instructions that were given at this phone interview. Patient denies chest pain, sob, fever, cough at the interview.

## 2024-10-24 MED ORDER — TOPIRAMATE 50 MG PO TABS: 50.0000 mg | ORAL_TABLET | Freq: Every day | ORAL | 5 refills | Status: AC

## 2024-10-24 NOTE — Telephone Encounter (Signed)
 Patient advised of Results.  Topiramate  50 mg Daily refilled until June follow up.

## 2024-10-26 ENCOUNTER — Encounter: Admitting: Obstetrics and Gynecology

## 2024-11-04 DIAGNOSIS — Z01818 Encounter for other preprocedural examination: Secondary | ICD-10-CM

## 2024-11-05 ENCOUNTER — Ambulatory Visit (HOSPITAL_COMMUNITY): Payer: Self-pay | Admitting: Registered Nurse

## 2024-11-05 ENCOUNTER — Ambulatory Visit (HOSPITAL_COMMUNITY)
Admission: RE | Admit: 2024-11-05 | Discharge: 2024-11-05 | Disposition: A | Discharge status: Home / Self Care | Attending: Obstetrics and Gynecology | Admitting: Obstetrics and Gynecology

## 2024-11-05 ENCOUNTER — Ambulatory Visit (HOSPITAL_BASED_OUTPATIENT_CLINIC_OR_DEPARTMENT_OTHER): Payer: Self-pay | Admitting: Registered Nurse

## 2024-11-05 ENCOUNTER — Other Ambulatory Visit: Payer: Self-pay

## 2024-11-05 ENCOUNTER — Encounter (HOSPITAL_COMMUNITY): Payer: Self-pay | Admitting: Obstetrics and Gynecology

## 2024-11-05 ENCOUNTER — Encounter: Admission: RE | Payer: Self-pay | Source: Home / Self Care

## 2024-11-05 DIAGNOSIS — N393 Stress incontinence (female) (male): Secondary | ICD-10-CM

## 2024-11-05 DIAGNOSIS — Z30433 Encounter for removal and reinsertion of intrauterine contraceptive device: Secondary | ICD-10-CM | POA: Diagnosis not present

## 2024-11-05 DIAGNOSIS — N812 Incomplete uterovaginal prolapse: Secondary | ICD-10-CM | POA: Diagnosis present

## 2024-11-05 DIAGNOSIS — N319 Neuromuscular dysfunction of bladder, unspecified: Secondary | ICD-10-CM | POA: Diagnosis not present

## 2024-11-05 DIAGNOSIS — Z01818 Encounter for other preprocedural examination: Secondary | ICD-10-CM

## 2024-11-05 HISTORY — PX: VAGINAL PROLAPSE REPAIR: SHX830

## 2024-11-05 HISTORY — PX: CYSTOSCOPY: SHX5120

## 2024-11-05 HISTORY — PX: PERINEOPLASTY: SHX2218

## 2024-11-05 HISTORY — PX: INTRAUTERINE DEVICE (IUD) INSERTION: SHX5877

## 2024-11-05 HISTORY — PX: EXAM UNDER ANESTHESIA, PELVIC: SHX7461

## 2024-11-05 HISTORY — PX: ANTERIOR AND POSTERIOR REPAIR: SHX5121

## 2024-11-05 HISTORY — PX: BLADDER SUSPENSION: SHX72

## 2024-11-05 LAB — POCT PREGNANCY, URINE: Preg Test, Ur: NEGATIVE

## 2024-11-05 SURGERY — ANTERIOR (CYSTOCELE) AND POSTERIOR REPAIR (RECTOCELE)
Anesthesia: General | Site: Uterus

## 2024-11-05 MED ORDER — ONDANSETRON HCL 4 MG/2ML IJ SOLN: 4.0000 mg | Freq: Once | INTRAMUSCULAR | Status: DC | PRN

## 2024-11-05 MED ORDER — ACETAMINOPHEN 500 MG PO TABS
1000.0000 mg | ORAL_TABLET | ORAL | Status: AC
  Administered 2024-11-05: 1000 mg via ORAL

## 2024-11-05 MED ORDER — ROCURONIUM BROMIDE 10 MG/ML (PF) SYRINGE
PREFILLED_SYRINGE | INTRAVENOUS | Status: DC | PRN
  Administered 2024-11-05: 80 mg via INTRAVENOUS

## 2024-11-05 MED ORDER — SCOPOLAMINE 1 MG/3DAYS TD PT72
MEDICATED_PATCH | TRANSDERMAL | Status: AC
  Filled 2024-11-05: qty 1

## 2024-11-05 MED ORDER — ROCURONIUM BROMIDE 10 MG/ML (PF) SYRINGE
PREFILLED_SYRINGE | INTRAVENOUS | Status: AC
  Filled 2024-11-05: qty 10

## 2024-11-05 MED ORDER — AMISULPRIDE (ANTIEMETIC) 5 MG/2ML IV SOLN
INTRAVENOUS | Status: AC
  Filled 2024-11-05: qty 4

## 2024-11-05 MED ORDER — MIDAZOLAM HCL (PF) 2 MG/2ML IJ SOLN
INTRAMUSCULAR | Status: DC | PRN
  Administered 2024-11-05: 2 mg via INTRAVENOUS

## 2024-11-05 MED ORDER — DEXAMETHASONE SOD PHOSPHATE PF 10 MG/ML IJ SOLN
INTRAMUSCULAR | Status: DC | PRN
  Administered 2024-11-05: 10 mg via INTRAVENOUS

## 2024-11-05 MED ORDER — HYDROMORPHONE HCL 1 MG/ML IJ SOLN
0.2500 mg | INTRAMUSCULAR | Status: DC | PRN
  Administered 2024-11-05: 0.25 mg via INTRAVENOUS

## 2024-11-05 MED ORDER — LIDOCAINE-EPINEPHRINE 1 %-1:100000 IJ SOLN
INTRAMUSCULAR | Status: DC | PRN
  Administered 2024-11-05: 20 mL

## 2024-11-05 MED ORDER — LEVONORGESTREL 20 MCG/DAY IU IUD
1.0000 | INTRAUTERINE_SYSTEM | INTRAUTERINE | Status: AC
  Administered 2024-11-05: 1 via INTRAUTERINE
  Filled 2024-11-05: qty 1

## 2024-11-05 MED ORDER — FENTANYL CITRATE (PF) 100 MCG/2ML IJ SOLN
INTRAMUSCULAR | Status: AC
  Filled 2024-11-05: qty 2

## 2024-11-05 MED ORDER — GABAPENTIN 300 MG PO CAPS
ORAL_CAPSULE | ORAL | Status: AC
  Filled 2024-11-05: qty 1

## 2024-11-05 MED ORDER — ONDANSETRON HCL 4 MG/2ML IJ SOLN
INTRAMUSCULAR | Status: DC | PRN
  Administered 2024-11-05: 4 mg via INTRAVENOUS

## 2024-11-05 MED ORDER — SCOPOLAMINE 1 MG/3DAYS TD PT72
1.0000 | MEDICATED_PATCH | TRANSDERMAL | Status: DC
  Administered 2024-11-05: 1 mg via TRANSDERMAL

## 2024-11-05 MED ORDER — LIDOCAINE 2% (20 MG/ML) 5 ML SYRINGE
INTRAMUSCULAR | Status: DC | PRN
  Administered 2024-11-05: 60 mg via INTRAVENOUS

## 2024-11-05 MED ORDER — LIDOCAINE 2% (20 MG/ML) 5 ML SYRINGE
INTRAMUSCULAR | Status: AC
  Filled 2024-11-05: qty 5

## 2024-11-05 MED ORDER — OXYCODONE HCL 5 MG/5ML PO SOLN: 5.0000 mg | Freq: Once | ORAL | Status: DC | PRN

## 2024-11-05 MED ORDER — MIDAZOLAM HCL 2 MG/2ML IJ SOLN
INTRAMUSCULAR | Status: AC
  Filled 2024-11-05: qty 2

## 2024-11-05 MED ORDER — ORAL CARE MOUTH RINSE: 15.0000 mL | Freq: Once | OROMUCOSAL | Status: AC

## 2024-11-05 MED ORDER — PHENYLEPHRINE 80 MCG/ML (10ML) SYRINGE FOR IV PUSH (FOR BLOOD PRESSURE SUPPORT)
PREFILLED_SYRINGE | INTRAVENOUS | Status: AC
  Filled 2024-11-05: qty 10

## 2024-11-05 MED ORDER — PROPOFOL 10 MG/ML IV BOLUS
INTRAVENOUS | Status: DC | PRN
  Administered 2024-11-05: 200 mg via INTRAVENOUS

## 2024-11-05 MED ORDER — CHLORHEXIDINE GLUCONATE 0.12 % MT SOLN
15.0000 mL | Freq: Once | OROMUCOSAL | Status: AC
  Administered 2024-11-05: 15 mL via OROMUCOSAL

## 2024-11-05 MED ORDER — CEFAZOLIN SODIUM-DEXTROSE 2-4 GM/100ML-% IV SOLN
2.0000 g | INTRAVENOUS | Status: AC
  Administered 2024-11-05: 2 g via INTRAVENOUS

## 2024-11-05 MED ORDER — SODIUM CHLORIDE 0.9 % IR SOLN
Status: DC | PRN
  Administered 2024-11-05: 1000 mL via INTRAVESICAL

## 2024-11-05 MED ORDER — PHENAZOPYRIDINE HCL 100 MG PO TABS
ORAL_TABLET | ORAL | Status: AC
  Filled 2024-11-05: qty 2

## 2024-11-05 MED ORDER — LEVONORGESTREL 20 MCG/DAY IU IUD
INTRAUTERINE_SYSTEM | INTRAUTERINE | Status: AC
  Filled 2024-11-05: qty 1

## 2024-11-05 MED ORDER — MEPERIDINE HCL 25 MG/ML IJ SOLN: 6.2500 mg | INTRAMUSCULAR | Status: DC | PRN

## 2024-11-05 MED ORDER — FENTANYL CITRATE (PF) 250 MCG/5ML IJ SOLN
INTRAMUSCULAR | Status: DC | PRN
  Administered 2024-11-05: 100 ug via INTRAVENOUS

## 2024-11-05 MED ORDER — 0.9 % SODIUM CHLORIDE (POUR BTL) OPTIME
TOPICAL | Status: DC | PRN
  Administered 2024-11-05: 1000 mL

## 2024-11-05 MED ORDER — PHENYLEPHRINE 80 MCG/ML (10ML) SYRINGE FOR IV PUSH (FOR BLOOD PRESSURE SUPPORT)
PREFILLED_SYRINGE | INTRAVENOUS | Status: DC | PRN
  Administered 2024-11-05: 80 ug via INTRAVENOUS

## 2024-11-05 MED ORDER — SUGAMMADEX SODIUM 200 MG/2ML IV SOLN
INTRAVENOUS | Status: DC | PRN
  Administered 2024-11-05: 200 mg via INTRAVENOUS

## 2024-11-05 MED ORDER — ONDANSETRON HCL 4 MG/2ML IJ SOLN
INTRAMUSCULAR | Status: AC
  Filled 2024-11-05: qty 2

## 2024-11-05 MED ORDER — AMISULPRIDE (ANTIEMETIC) 5 MG/2ML IV SOLN
10.0000 mg | Freq: Once | INTRAVENOUS | Status: AC | PRN
  Administered 2024-11-05: 10 mg via INTRAVENOUS

## 2024-11-05 MED ORDER — GABAPENTIN 300 MG PO CAPS
300.0000 mg | ORAL_CAPSULE | ORAL | Status: AC
  Administered 2024-11-05: 300 mg via ORAL

## 2024-11-05 MED ORDER — LACTATED RINGERS IV SOLN: INTRAVENOUS | Status: DC

## 2024-11-05 MED ORDER — CHLORHEXIDINE GLUCONATE 0.12 % MT SOLN
OROMUCOSAL | Status: AC
  Filled 2024-11-05: qty 15

## 2024-11-05 MED ORDER — KETOROLAC TROMETHAMINE 30 MG/ML IJ SOLN: 30.0000 mg | Freq: Once | INTRAMUSCULAR | Status: DC | PRN

## 2024-11-05 MED ORDER — PHENYLEPHRINE HCL-NACL 20-0.9 MG/250ML-% IV SOLN
INTRAVENOUS | Status: DC | PRN
  Administered 2024-11-05: 20 ug/min via INTRAVENOUS

## 2024-11-05 MED ORDER — EPHEDRINE SULFATE-NACL 50-0.9 MG/10ML-% IV SOSY: PREFILLED_SYRINGE | INTRAVENOUS | Status: DC | PRN

## 2024-11-05 MED ORDER — CEFAZOLIN SODIUM-DEXTROSE 2-4 GM/100ML-% IV SOLN
INTRAVENOUS | Status: AC
  Filled 2024-11-05: qty 100

## 2024-11-05 MED ORDER — HYDROMORPHONE HCL 1 MG/ML IJ SOLN
INTRAMUSCULAR | Status: AC
  Filled 2024-11-05: qty 1

## 2024-11-05 MED ORDER — PROPOFOL 10 MG/ML IV BOLUS
INTRAVENOUS | Status: AC
  Filled 2024-11-05: qty 20

## 2024-11-05 MED ORDER — ACETAMINOPHEN 500 MG PO TABS
ORAL_TABLET | ORAL | Status: AC
  Filled 2024-11-05: qty 2

## 2024-11-05 MED ORDER — OXYCODONE HCL 5 MG PO TABS: 5.0000 mg | ORAL_TABLET | Freq: Once | ORAL | Status: DC | PRN

## 2024-11-05 MED ORDER — LIDOCAINE-EPINEPHRINE 1 %-1:100000 IJ SOLN
INTRAMUSCULAR | Status: AC
  Filled 2024-11-05: qty 2

## 2024-11-05 MED ORDER — PHENAZOPYRIDINE HCL 100 MG PO TABS
200.0000 mg | ORAL_TABLET | ORAL | Status: AC
  Administered 2024-11-05: 200 mg via ORAL

## 2024-11-05 SURGICAL SUPPLY — 36 items
BLADE SURG 15 STRL LF DISP TIS (BLADE) ×4 IMPLANT
DERMABOND ADVANCED .7 DNX12 (GAUZE/BANDAGES/DRESSINGS) ×4 IMPLANT
DEVICE CAPIO SLIM SINGLE (INSTRUMENTS) IMPLANT
GLOVE BIOGEL PI IND STRL 6 (GLOVE) IMPLANT
GLOVE BIOGEL PI IND STRL 6.5 (GLOVE) ×4 IMPLANT
GLOVE BIOGEL PI IND STRL 7.0 (GLOVE) ×4 IMPLANT
GLOVE BIOGEL PI MICRO STRL 6 (GLOVE) ×4 IMPLANT
GLOVE SURG SS PI 7.0 STRL IVOR (GLOVE) IMPLANT
GOWN STRL REUS W/ TWL LRG LVL3 (GOWN DISPOSABLE) ×4 IMPLANT
GOWN STRL SURGICAL XL XLNG (GOWN DISPOSABLE) IMPLANT
HIBICLENS CHG 4% 4OZ (MISCELLANEOUS) ×4 IMPLANT
HOLDER FOLEY CATH W/STRAP (MISCELLANEOUS) ×4 IMPLANT
KIT TURNOVER KIT B (KITS) ×4 IMPLANT
MANIFOLD NEPTUNE II (INSTRUMENTS) ×4 IMPLANT
MIRENA IUD IMPLANT
NEEDLE 1/2 CIR CATGUT .05X1.09 (NEEDLE) IMPLANT
NEEDLE HYPO 22X1.5 SAFETY MO (MISCELLANEOUS) ×4 IMPLANT
NEEDLE MAYO 6 CRC TAPER PT (NEEDLE) IMPLANT
PACK VAGINAL WOMENS (CUSTOM PROCEDURE TRAY) ×4 IMPLANT
PAD OB MATERNITY 11 LF (PERSONAL CARE ITEMS) ×4 IMPLANT
RETRACTOR LONE STAR DISPOSABLE (INSTRUMENTS) ×4 IMPLANT
RETRACTOR STAY HOOK 5MM (MISCELLANEOUS) ×4 IMPLANT
SET IRRIG Y-TYPE CYSTO (SET/KITS/TRAYS/PACK) ×4 IMPLANT
SLEEVE SCD COMPRESS KNEE MED (STOCKING) ×4 IMPLANT
SLING ADVANTAGE FIT TRANVAG (Sling) IMPLANT
SOLN 0.9% NACL POUR BTL 1000ML (IV SOLUTION) ×4 IMPLANT
SOUND UTERINE 9.75 DISP STRL (MISCELLANEOUS) IMPLANT
SPIKE FLUID TRANSFER (MISCELLANEOUS) ×4 IMPLANT
SUCTION TUBE FRAZIER 10FR DISP (SUCTIONS) ×4 IMPLANT
SUT ABS MONO DBL WITH NDL 48IN (SUTURE) IMPLANT
SUT VIC AB 0 CT1 27XBRD ANBCTR (SUTURE) ×4 IMPLANT
SUT VIC AB 2-0 SH 27XBRD (SUTURE) ×4 IMPLANT
SUT VICRYL 2-0 SH 8X27 (SUTURE) ×4 IMPLANT
SYR BULB EAR ULCER 3OZ GRN STR (SYRINGE) ×4 IMPLANT
TOWEL GREEN STERILE (TOWEL DISPOSABLE) ×4 IMPLANT
TRAY FOLEY W/BAG SLVR 14FR LF (SET/KITS/TRAYS/PACK) ×4 IMPLANT

## 2024-11-05 NOTE — Discharge Instructions (Signed)

## 2024-11-05 NOTE — Anesthesia Preprocedure Evaluation (Addendum)
"                                    Anesthesia Evaluation  Patient identified by MRN, date of birth, ID band Patient awake    Reviewed: Allergy  & Precautions, NPO status , Patient's Chart, lab work & pertinent test results  Airway Mallampati: I  TM Distance: >3 FB Neck ROM: Full    Dental  (+) Teeth Intact, Dental Advisory Given   Pulmonary asthma (well controlled)    Pulmonary exam normal breath sounds clear to auscultation       Cardiovascular negative cardio ROS Normal cardiovascular exam Rhythm:Regular Rate:Normal     Neuro/Psych negative neurological ROS     GI/Hepatic Neg liver ROS,GERD  Controlled,,  Endo/Other  negative endocrine ROS    Renal/GU negative Renal ROS Bladder dysfunction      Musculoskeletal negative musculoskeletal ROS (+)    Abdominal   Peds  Hematology negative hematology ROS (+)   Anesthesia Other Findings   Reproductive/Obstetrics prolapse                              Anesthesia Physical Anesthesia Plan  ASA: 2  Anesthesia Plan: General   Post-op Pain Management: Tylenol  PO (pre-op)*, Toradol  IV (intra-op)*, Ketamine IV* and Dilaudid  IV   Induction: Intravenous  PONV Risk Score and Plan: 4 or greater and Ondansetron , Dexamethasone , Midazolam  and Treatment may vary due to age or medical condition  Airway Management Planned: Oral ETT  Additional Equipment: None  Intra-op Plan:   Post-operative Plan: Extubation in OR  Informed Consent: I have reviewed the patients History and Physical, chart, labs and discussed the procedure including the risks, benefits and alternatives for the proposed anesthesia with the patient or authorized representative who has indicated his/her understanding and acceptance.     Dental advisory given  Plan Discussed with: CRNA  Anesthesia Plan Comments:          Anesthesia Quick Evaluation  "

## 2024-11-05 NOTE — Progress Notes (Signed)
 2nd attempt to call pt to relay lab results - no answer, unable to LVM

## 2024-11-05 NOTE — Interval H&P Note (Signed)
 History and Physical Interval Note:  11/05/2024 10:31 AM  Marcia Miller  has presented today for surgery, with the diagnosis of Uterovaginal prolapse, incomplete SUI stress urinary incontinence, female Encounter for initial prescription of intrauterine contraceptive device.  The various methods of treatment have been discussed with the patient and family. After consideration of risks, benefits and other options for treatment, the patient has consented to  Procedures with comments: ANTERIOR (CYSTOCELE) AND POSTERIOR REPAIR (RECTOCELE) (N/A) - Anterior and posterior repair with perineorrhaphy SACROSPINOUS HYSTEROPEXY (N/A) - sacrospinous hysteropexy CREATION, URETHRAL SLING, RETROPUBIC APPROACH, USING POLYPROPYLENE TAPE (N/A) REMOVAL AND INSERTION OF INTRAUTERINE DEVICE (N/A) CYSTOSCOPY (N/A) PERINEOPLASTY (N/A) EXAM UNDER ANESTHESIA, PELVIC as a surgical intervention.  The patient's history has been reviewed, patient examined, no change in status, stable for surgery.  I have reviewed the patient's chart and labs.  Questions were answered to the patient's satisfaction.     [name]

## 2024-11-05 NOTE — Op Note (Signed)
 Operative Note  Preoperative Diagnosis: anterior vaginal prolapse, posterior vaginal prolapse, uterovaginal prolapse, incomplete, and stress urinary incontinence  Postoperative Diagnosis: same  Procedures performed:  Anterior and posterior repair with perineorrhpahy, sacrospinous ligament fixation, midurethral sling (Advantage Fit), cystoscopy, removal and insertion of IUD (Mirena )  Implants:  Implant Name Type Inv. Item Serial No. Manufacturer Lot No. LRB No. Used Action  MIRENA  IUD     [id number] N/A 1 Implanted  [place] [employer] [facility] - [id number] Sling SLING ADVANTAGE FIT RICKA [id number] [employer] [id number] N/A 1 Implanted    Attending Surgeon: Rosaline Caper, MD  Anesthesia: General endotracheal  Findings: 1. On vaginal exam, stage II prolapse present  2. On cystoscopy, normal bladder and urethral mucosa without injury or lesion. Brisk bilateral ureteral efflux present.    Specimens: none  Estimated blood loss: 100 mL  IV fluids: 800 mL  Urine output: 350 mL  Complications: none  Procedure in Detail:  After informed consent was obtained, the patient was taken to the operating room where anesthesia was induced and found to be adequate. She was placed in dorsal lithotomy position, taking care to avoid any traction on the extremities, and then prepped and draped in the usual sterile fashion. A self-retaining lonestar retractor was placed using four elastic blue stays.  After a foley catheter was inserted into the urethra, the location of the midurethra was palpated. The cervix was grasped with a tenaculum. The IUD strings were visualized and clamped and IUD was easily removed. The uterus sounded to 9cm. A new Mirena  IUD was inserted and the strings were trimmed at the cervix.    Two Allis clamps were along the anterior vaginal wall defect. 1% lidocaine  with epinephrine  was injected into the vaginal mucosa.  A vertical incision was made between  these two Allis clamps with a 15 blade scalpel.  Allis clamps were placed along this incision and Metzenbaum scissors were used to undermine the vaginal mucosa along the incision.  The vaginal mucosa was then sharply dissected off to the vesicovaginal septum bilaterally to the level of the pubic rami.    For the sacrospinous ligament fixation (SSLF), the ischial spine was accessed on the right side via dissection with Metzenbaum scissors and blunt dissection.  The sacrospinous ligament was palpated. Two 0 PDS suture was then placed at the sacrospinous ligament two fingerbreadths medial to the ischial spine, in order to avoid the pudendal neurovascular bundle, using a Capio needle driver.  The PDS suture was attached with a free needle through the anterior cervix. Anterior plication of the vesicovaginal septum was then performed using mattress sutures of 2-0 Vicryl. The vaginal mucosal edges were trimmed and the incision reapproximated with 2-0 Vicryl in a running fashion. The SSLF suture was then tied down with excellent support of the anterior and apical vagina.  The retropubic midurethral sling was performed next. Two Allis clamps were placed lateral to the location of the midurethra. 1% lidocaine  with epinephrine  was injected into the vaginal mucosa and laterally.  Using a 15 blade scapel a small vertical incision was made in the vaginal mucosa. Metzenbaum scissors were used to dissect the vaginal mucosa off of the underlying muscularis and to create the periurethral tunnels. The trocar and attached sling were introduced into the right side of the vaginal incision, just inferior to the pubic symphysis on the right side. The trocar was guided through the endopelvic fascia and directly behind the pubic symphysis through the retropubic space, vertically.  The trocar was  guided out through the suprapubic region, 2 fingerbreadths lateral to midline at the level of the pubic symphysis on the ipsilateral side. The  trocar was similarly placed on the left side.  The Foley catheter was removed.  A 70-degree cystoscope was introduced, and 360-degree inspection revealed no trauma or trocars in the bladder, with bilateral ureteral efflux.  The bladder was drained and the cystoscope was removed.  The Foley catheter was reinserted. The sling was brought to lie beneath the mid-urethra.  An empty needle driver was placed behind the sling to ensure a tension free placement. The trocars were removed.  The plastic sheath was removed from the sling and the distal ends of the sling were trimmed just below the level of the skin incisions. The skin incisions were closed with dermabond.  Hemostasis was noted.  Tension-free positioning of the sling was confirmed.  The vaginal incision was closed with 2-0 vicryl.  Attention was then turned to the posterior vagina.  Two Allis clamps were in the midline of the posterior vaginal wall defect.  1% lidocaine  with epinephrine  was injected into the vaginal mucosa. A vertical incision was made between these clamps with a 15 blade scalpel and a diamond shape incision was made over the perineum.  The rectovaginal septum was then dissected off the vaginal mucosa bilaterally.  No enterocele was noted.  The rectovaginal septum was then plicated with mattress sutures of 2-0 Vicryl.  After placement of the first plication stitch two fingers were inserted into the vaginal to confirm adequate caliber.  The last distal stitch incorporated the perineal body in a U stitch fashion.  The bulbocavernosus muscles were reapproximated using interrupted stitches of 2-0 Vicryl and the hymenal ring was redeveloped. After plication, the excess vaginal mucosa was trimmed and the vaginal mucosa was reapproximated using 2-0 Vicryl sutures in a running fashion. The perineal body was reapproximated with two interrupted 0-vicryl sutures. The perineal body was closed with 2-0 vicryl in a subcutaneous and then running fashion. The  vagina was copiously irrigated.  Hemostasis was noted.  Vaginal packing was not placed.  A rectal examination was normal and confirmed no sutures within the rectum.  The patient tolerated the procedure well.  She was awakened from anesthesia and transferred to the recovery room in stable condition. All counts were correct x 2.    [name], MD

## 2024-11-05 NOTE — Anesthesia Postprocedure Evaluation (Signed)
"   Anesthesia Post Note  Patient: Marcia Miller  Procedure(s) Performed: ANTERIOR (CYSTOCELE) AND POSTERIOR REPAIR (RECTOCELE) (Vagina ) SACROSPINOUS HYSTEROPEXY (Vagina ) CREATION, URETHRAL SLING, RETROPUBIC APPROACH, USING POLYPROPYLENE TAPE (Vagina ) REMOVAL AND INSERTION OF INTRAUTERINE DEVICE (Uterus) CYSTOSCOPY (Bladder) PERINEOPLASTY (Vagina ) EXAM UNDER ANESTHESIA, PELVIC (Pelvis)     Patient location during evaluation: PACU Anesthesia Type: General Level of consciousness: awake and alert Pain management: pain level controlled Vital Signs Assessment: post-procedure vital signs reviewed and stable Respiratory status: spontaneous breathing, nonlabored ventilation and respiratory function stable Cardiovascular status: blood pressure returned to baseline and stable Postop Assessment: no apparent nausea or vomiting Anesthetic complications: no   There were no known notable events for this encounter.  Last Vitals:  Vitals:   11/05/24 1515 11/05/24 1530  BP: 103/70 101/68  Pulse: 73 68  Resp: 19 (!) 21  Temp:  36.4 C  SpO2: 98% 98%    Last Pain:  Vitals:   11/05/24 1451  TempSrc:   PainSc: 8    Pain Goal:                   Garnette FORBES Skillern      "

## 2024-11-05 NOTE — Anesthesia Procedure Notes (Signed)
 Procedure Name: Intubation Date/Time: 11/05/2024 11:21 AM  Performed by: Christopher Comings, CRNAPre-anesthesia Checklist: Patient identified, Emergency Drugs available, Suction available and Patient being monitored Patient Re-evaluated:Patient Re-evaluated prior to induction Oxygen Delivery Method: Circle system utilized Preoxygenation: Pre-oxygenation with 100% oxygen Induction Type: IV induction Ventilation: Mask ventilation without difficulty Laryngoscope Size: Mac and 4 Grade View: Grade I Tube type: Oral Tube size: 7.0 mm Number of attempts: 1 Airway Equipment and Method: Stylet and Oral airway Placement Confirmation: ETT inserted through vocal cords under direct vision, positive ETCO2 and breath sounds checked- equal and bilateral Secured at: 22 cm Tube secured with: Tape Dental Injury: Teeth and Oropharynx as per pre-operative assessment

## 2024-11-05 NOTE — Transfer of Care (Signed)
 Immediate Anesthesia Transfer of Care Note  Patient: Marcia Miller  Procedure(s) Performed: ANTERIOR (CYSTOCELE) AND POSTERIOR REPAIR (RECTOCELE) (Vagina ) SACROSPINOUS HYSTEROPEXY (Vagina ) CREATION, URETHRAL SLING, RETROPUBIC APPROACH, USING POLYPROPYLENE TAPE (Vagina ) REMOVAL AND INSERTION OF INTRAUTERINE DEVICE (Uterus) CYSTOSCOPY (Bladder) PERINEOPLASTY (Vagina ) EXAM UNDER ANESTHESIA, PELVIC (Pelvis)  Patient Location: PACU  Anesthesia Type:General  Level of Consciousness: drowsy and patient cooperative  Airway & Oxygen Therapy: Patient Spontanous Breathing  Post-op Assessment: Report given to RN and Post -op Vital signs reviewed and stable  Post vital signs: Reviewed and stable  Last Vitals:  Vitals Value Taken Time  BP 115/76 11/05/24 13:06  Temp    Pulse 95 11/05/24 13:08  Resp 18 11/05/24 13:08  SpO2 99 % 11/05/24 13:08  Vitals shown include unfiled device data.  Last Pain:  Vitals:   11/05/24 1035  TempSrc: Oral  PainSc: 0-No pain         Complications: There were no known notable events for this encounter.

## 2024-11-06 ENCOUNTER — Encounter (HOSPITAL_COMMUNITY): Payer: Self-pay | Admitting: Obstetrics and Gynecology

## 2024-11-06 ENCOUNTER — Telehealth: Payer: Self-pay | Admitting: Obstetrics and Gynecology

## 2024-11-06 NOTE — Telephone Encounter (Signed)
 Marcia Miller underwent Anterior and posterior repair with perineorrhpahy, sacrospinous ligament fixation, midurethral sling (Advantage Fit), cystoscopy, removal and insertion of IUD (Mirena ) on 11/06/2024.   Marcia Miller failed her voiding trial.  was backfilled into the bladder Pt was unable to void  Marcia Miller was discharged with a catheter. Marcia Miller is scheduled for voiding trial on 11/07/24. Please call her for a routine post op check. Thanks!  [name], MD

## 2024-11-07 ENCOUNTER — Ambulatory Visit

## 2024-11-07 NOTE — Progress Notes (Signed)
 Marcia Miller  underwent Anterior (cystocele) And Posterior Repair (rectocele), Sacrospinous Hysteropexy, Creation, Urethral Sling, Retropubic Approach, Using Polypropylene Tape, Removal And Insertion Of Intrauterine Device, Cystoscopy, Perineoplasty, and Exam Under Anesthesia, Pelvic  on 11/05/2024  with [x] Dr Marilynne [] Dr Guadlupe.  The patient reports that her pain is not controlled.  She is taking [] No Medication [x] Acetaminophen  500mg  every 6 hours [x] Ibuprofen  600mg  every 6 hours or [x]  Prescribed Narcotic.  Her pain level is 10[] with medication [] Without medication is.   She reports vaginal bleeding.  The patient is tolerating PO fluids and has not had solids yet.She has not had a bowel movement and is taking Miralax  for a bowel regimen. She is not passing gas.  She was discharged with a catheter.   []  Discharged without a catheter, the patient does feel as if she is emptying her bladder.  [] Discharged with a catheter, the patient is not having any concerns with her catheter.  She will return for a voiding trial. [] Verified scheduled date and time with patient.  She does having any additional questions.  Reviewed Post operative instructions as needed to answer additional questions.   CC'd note to patient's provider.   [name] underwent Anterior (cystocele) And Posterior Repair (rectocele), Sacrospinous Hysteropexy, Creation, Urethral Sling, Retropubic Approach, Using Polypropylene Tape, Removal And Insertion Of Intrauterine Device, Cystoscopy, Perineoplasty, and Exam Under Anesthesia, Pelvic on 11/05/2024  She presents for a voiding trial.   Patient was identified with 2 identifiers.  The patient states she does not have any concerns with the foley placed.  250 mL of NS was instilled into the bladder via a catheter.  The catheter was removed and patient was instructed to void into the urinary hat.  She voided 350 mL.  She did pass the voiding trial.  The patient was not sent home with a catheter.    The  patient received aftercare instructions and will follow up as scheduled.

## 2024-11-07 NOTE — Patient Instructions (Signed)
  Please drink lots of water and try to expel as much urine as you are inputting. If you are unable to urinate, give the office a call before 2 pm.     Please keep all future appointments and if you have any questions or concerns please feel free to contact our office at [phone].

## 2024-11-23 ENCOUNTER — Other Ambulatory Visit (HOSPITAL_COMMUNITY)
Admission: RE | Admit: 2024-11-23 | Discharge: 2024-11-23 | Disposition: A | Source: Ambulatory Visit | Attending: Obstetrics and Gynecology | Admitting: Obstetrics and Gynecology

## 2024-11-23 ENCOUNTER — Ambulatory Visit: Admitting: Obstetrics and Gynecology

## 2024-11-23 ENCOUNTER — Encounter: Payer: Self-pay | Admitting: Obstetrics and Gynecology

## 2024-11-23 VITALS — BP 102/71 | HR 84

## 2024-11-23 DIAGNOSIS — B3731 Acute candidiasis of vulva and vagina: Secondary | ICD-10-CM | POA: Diagnosis present

## 2024-11-23 DIAGNOSIS — Z48816 Encounter for surgical aftercare following surgery on the genitourinary system: Secondary | ICD-10-CM

## 2024-11-23 MED ORDER — FLUCONAZOLE 150 MG PO TABS: 150.0000 mg | ORAL_TABLET | ORAL | 0 refills | Status: AC

## 2024-11-23 NOTE — Progress Notes (Signed)
 [facility]  Date of Visit: 11/23/2024  History of Present Illness: Marcia Miller is a 49 y.o. female scheduled today for a post-operative visit.   Surgery: s/p Anterior and posterior repair with perineorrhpahy, sacrospinous ligament fixation, midurethral sling (Advantage Fit), cystoscopy, removal and insertion of IUD (Mirena ) on 11/05/24  She did not pass her postoperative void trial. Returned for voiding trial on 1/7 and passed.   Noticed a white discharge a few days after surgery on the vulva. Then started her period about 4 days ago. Still having a lot of vaginal discharge with a bad odor. She is wondering if there is an infection. 3 days ago, she noticed a bump at the vaginal opening. Sometimes she feels a lot of pressure and heaviness. She is not lifting anything.    Medications: She has a current medication list which includes the following prescription(s): acetaminophen , acetaminophen , albuterol , esomeprazole , fluconazole, fluticasone , fluticasone -salmeterol, ibuprofen , iron  (ferrous sulfate ), levocetirizine, montelukast , oxycodone , phenazopyridine , polyethylene glycol powder, rizatriptan , topiramate , and triamcinolone .   Allergies: Patient has no known allergies.   Physical Exam: BP 102/71   Pulse 84    Pelvic Examination: Vulva with thick white discharge, aptima obtained. Perineal sutures intact, healing well. Vagina: Incisions healing well. Sutures are present at incision line and there is not granulation tissue. Small amount of blood in the vault, slight yellow discharge. Cervix appears normal, IUD strings noted.  No visible or palpable mesh.  ---------------  Assessment and Plan:  1. Vaginal yeast infection     - Aptima obtained - Prescribed diflucan q 72 hrs x 2 doses for vaginal yeast infection.  - Discussed normal healing expectations - Continue pelvic rest and lifting restrictions.   F/u 12/14/24 or sooner if needed  [name], MD

## 2024-11-26 LAB — CERVICOVAGINAL ANCILLARY ONLY
Bacterial Vaginitis (gardnerella): NEGATIVE
Candida Glabrata: NEGATIVE
Candida Vaginitis: NEGATIVE
Comment: NEGATIVE
Comment: NEGATIVE
Comment: NEGATIVE

## 2024-12-05 ENCOUNTER — Telehealth: Payer: Self-pay

## 2024-12-05 NOTE — Telephone Encounter (Signed)
 Spoke to the patient with interpreter services. She understands the direction from Dr. Marilynne. She will try to get the hydrocortisone  for the itching, this can be applied to the vulva area. She is still concerned with the odor as she states it is strong and smells bad. Advised to call back if symptoms persist.

## 2024-12-05 NOTE — Telephone Encounter (Signed)
 It is normal to have light vaginal bleeding and discharge after surgery. Her swab was negative for any infection. It will just take time to resolve.

## 2024-12-05 NOTE — Telephone Encounter (Signed)
 Surgery: s/p Anterior and posterior repair with perineorrhpahy, sacrospinous ligament fixation, midurethral sling (Advantage Fit), cystoscopy, removal and insertion of IUD (Mirena ) on 11/05/24. With help from interpreter Grayce Leigh. Marcia Miller has complaints of a brown discharge with a slight odor. She does complain of vaginal itching and burning. She is using stool softener due to some recent constipation issues.  Treatment given at her office visit on 11-23-24 has not helped. Denies abdominal pain, nausea, fever or pain.

## 2024-12-14 ENCOUNTER — Encounter: Admitting: Obstetrics and Gynecology

## 2025-02-21 ENCOUNTER — Ambulatory Visit: Payer: Self-pay | Admitting: Family Medicine

## 2025-04-02 ENCOUNTER — Ambulatory Visit: Admitting: Neurology
